# Patient Record
Sex: Female | Born: 1975
Health system: Southern US, Community
[De-identification: ages and names within clinical notes are randomized; demographics above are authoritative.]

## PROBLEM LIST (undated history)

## (undated) DIAGNOSIS — F329 Major depressive disorder, single episode, unspecified: Secondary | ICD-10-CM

## (undated) DIAGNOSIS — I10 Essential (primary) hypertension: Secondary | ICD-10-CM

## (undated) DIAGNOSIS — E119 Type 2 diabetes mellitus without complications: Secondary | ICD-10-CM

## (undated) DIAGNOSIS — D573 Sickle-cell trait: Secondary | ICD-10-CM

## (undated) DIAGNOSIS — F32A Depression, unspecified: Secondary | ICD-10-CM

## (undated) HISTORY — PX: TUBAL LIGATION: SHX77

## (undated) HISTORY — DX: Depression, unspecified: F32.A

## (undated) HISTORY — PX: OTHER SURGICAL HISTORY: SHX169

## (undated) HISTORY — DX: Major depressive disorder, single episode, unspecified: F32.9

## (undated) HISTORY — PX: LEEP: SHX91

---

## 2001-04-22 ENCOUNTER — Emergency Department (HOSPITAL_COMMUNITY): Admission: EM | Admit: 2001-04-22 | Discharge: 2001-04-22 | Payer: Self-pay | Admitting: Emergency Medicine

## 2001-07-31 ENCOUNTER — Emergency Department (HOSPITAL_COMMUNITY): Admission: EM | Admit: 2001-07-31 | Discharge: 2001-07-31 | Payer: Self-pay | Admitting: Emergency Medicine

## 2001-10-05 ENCOUNTER — Emergency Department (HOSPITAL_COMMUNITY): Admission: EM | Admit: 2001-10-05 | Discharge: 2001-10-06 | Payer: Self-pay

## 2002-01-09 ENCOUNTER — Emergency Department (HOSPITAL_COMMUNITY): Admission: EM | Admit: 2002-01-09 | Discharge: 2002-01-09 | Payer: Self-pay | Admitting: Emergency Medicine

## 2002-04-16 ENCOUNTER — Emergency Department (HOSPITAL_COMMUNITY): Admission: EM | Admit: 2002-04-16 | Discharge: 2002-04-17 | Payer: Self-pay | Admitting: Emergency Medicine

## 2003-08-24 ENCOUNTER — Emergency Department (HOSPITAL_COMMUNITY): Admission: AD | Admit: 2003-08-24 | Discharge: 2003-08-24 | Payer: Self-pay | Admitting: Emergency Medicine

## 2003-10-01 ENCOUNTER — Encounter: Payer: Self-pay | Admitting: Emergency Medicine

## 2003-10-01 ENCOUNTER — Emergency Department (HOSPITAL_COMMUNITY): Admission: EM | Admit: 2003-10-01 | Discharge: 2003-10-01 | Payer: Self-pay | Admitting: Emergency Medicine

## 2004-02-14 ENCOUNTER — Emergency Department (HOSPITAL_COMMUNITY): Admission: EM | Admit: 2004-02-14 | Discharge: 2004-02-14 | Payer: Self-pay | Admitting: Emergency Medicine

## 2004-04-09 ENCOUNTER — Emergency Department (HOSPITAL_COMMUNITY): Admission: EM | Admit: 2004-04-09 | Discharge: 2004-04-10 | Payer: Self-pay | Admitting: Emergency Medicine

## 2004-04-29 ENCOUNTER — Emergency Department (HOSPITAL_COMMUNITY): Admission: EM | Admit: 2004-04-29 | Discharge: 2004-04-29 | Payer: Self-pay | Admitting: Family Medicine

## 2004-05-17 ENCOUNTER — Emergency Department (HOSPITAL_COMMUNITY): Admission: EM | Admit: 2004-05-17 | Discharge: 2004-05-17 | Payer: Self-pay | Admitting: Family Medicine

## 2004-11-20 ENCOUNTER — Emergency Department (HOSPITAL_COMMUNITY): Admission: EM | Admit: 2004-11-20 | Discharge: 2004-11-20 | Payer: Self-pay | Admitting: Emergency Medicine

## 2005-02-28 ENCOUNTER — Emergency Department (HOSPITAL_COMMUNITY): Admission: EM | Admit: 2005-02-28 | Discharge: 2005-02-28 | Payer: Self-pay | Admitting: Emergency Medicine

## 2005-03-20 ENCOUNTER — Emergency Department (HOSPITAL_COMMUNITY): Admission: EM | Admit: 2005-03-20 | Discharge: 2005-03-20 | Payer: Self-pay | Admitting: Emergency Medicine

## 2005-06-02 ENCOUNTER — Ambulatory Visit: Payer: Self-pay | Admitting: Obstetrics and Gynecology

## 2005-06-30 ENCOUNTER — Other Ambulatory Visit: Admission: RE | Admit: 2005-06-30 | Discharge: 2005-06-30 | Payer: Self-pay | Admitting: Family Medicine

## 2005-06-30 ENCOUNTER — Encounter (INDEPENDENT_AMBULATORY_CARE_PROVIDER_SITE_OTHER): Payer: Self-pay | Admitting: *Deleted

## 2005-06-30 ENCOUNTER — Ambulatory Visit: Payer: Self-pay | Admitting: Obstetrics and Gynecology

## 2005-07-11 ENCOUNTER — Inpatient Hospital Stay (HOSPITAL_COMMUNITY): Admission: AD | Admit: 2005-07-11 | Discharge: 2005-07-11 | Payer: Self-pay | Admitting: Obstetrics & Gynecology

## 2005-07-19 ENCOUNTER — Ambulatory Visit: Payer: Self-pay | Admitting: Obstetrics and Gynecology

## 2005-08-06 ENCOUNTER — Emergency Department (HOSPITAL_COMMUNITY): Admission: EM | Admit: 2005-08-06 | Discharge: 2005-08-06 | Payer: Self-pay | Admitting: *Deleted

## 2005-08-14 ENCOUNTER — Emergency Department (HOSPITAL_COMMUNITY): Admission: EM | Admit: 2005-08-14 | Discharge: 2005-08-14 | Payer: Self-pay | Admitting: Emergency Medicine

## 2005-12-07 ENCOUNTER — Emergency Department (HOSPITAL_COMMUNITY): Admission: EM | Admit: 2005-12-07 | Discharge: 2005-12-07 | Payer: Self-pay | Admitting: Emergency Medicine

## 2005-12-08 ENCOUNTER — Emergency Department (HOSPITAL_COMMUNITY): Admission: EM | Admit: 2005-12-08 | Discharge: 2005-12-08 | Payer: Self-pay | Admitting: Emergency Medicine

## 2006-01-12 ENCOUNTER — Encounter: Payer: Self-pay | Admitting: Family Medicine

## 2006-01-12 ENCOUNTER — Ambulatory Visit: Payer: Self-pay | Admitting: Family Medicine

## 2006-01-23 ENCOUNTER — Emergency Department (HOSPITAL_COMMUNITY): Admission: EM | Admit: 2006-01-23 | Discharge: 2006-01-23 | Payer: Self-pay | Admitting: Family Medicine

## 2006-01-25 ENCOUNTER — Emergency Department (HOSPITAL_COMMUNITY): Admission: EM | Admit: 2006-01-25 | Discharge: 2006-01-25 | Payer: Self-pay | Admitting: Emergency Medicine

## 2006-03-13 ENCOUNTER — Emergency Department (HOSPITAL_COMMUNITY): Admission: EM | Admit: 2006-03-13 | Discharge: 2006-03-13 | Payer: Self-pay | Admitting: Emergency Medicine

## 2006-03-19 ENCOUNTER — Emergency Department (HOSPITAL_COMMUNITY): Admission: EM | Admit: 2006-03-19 | Discharge: 2006-03-19 | Payer: Self-pay | Admitting: Emergency Medicine

## 2006-04-03 ENCOUNTER — Emergency Department (HOSPITAL_COMMUNITY): Admission: EM | Admit: 2006-04-03 | Discharge: 2006-04-03 | Payer: Self-pay | Admitting: Family Medicine

## 2006-05-11 ENCOUNTER — Emergency Department (HOSPITAL_COMMUNITY): Admission: EM | Admit: 2006-05-11 | Discharge: 2006-05-11 | Payer: Self-pay | Admitting: Emergency Medicine

## 2006-08-21 ENCOUNTER — Emergency Department (HOSPITAL_COMMUNITY): Admission: EM | Admit: 2006-08-21 | Discharge: 2006-08-21 | Payer: Self-pay | Admitting: *Deleted

## 2006-08-24 ENCOUNTER — Emergency Department (HOSPITAL_COMMUNITY): Admission: EM | Admit: 2006-08-24 | Discharge: 2006-08-24 | Payer: Self-pay | Admitting: Emergency Medicine

## 2006-12-28 ENCOUNTER — Emergency Department (HOSPITAL_COMMUNITY): Admission: EM | Admit: 2006-12-28 | Discharge: 2006-12-28 | Payer: Self-pay | Admitting: Emergency Medicine

## 2007-01-25 ENCOUNTER — Inpatient Hospital Stay (HOSPITAL_COMMUNITY): Admission: AD | Admit: 2007-01-25 | Discharge: 2007-01-25 | Payer: Self-pay | Admitting: Family Medicine

## 2007-02-08 ENCOUNTER — Emergency Department (HOSPITAL_COMMUNITY): Admission: EM | Admit: 2007-02-08 | Discharge: 2007-02-08 | Payer: Self-pay | Admitting: Family Medicine

## 2007-03-29 ENCOUNTER — Ambulatory Visit: Payer: Self-pay | Admitting: Obstetrics & Gynecology

## 2007-03-29 ENCOUNTER — Encounter: Payer: Self-pay | Admitting: Obstetrics & Gynecology

## 2007-04-10 ENCOUNTER — Emergency Department (HOSPITAL_COMMUNITY): Admission: EM | Admit: 2007-04-10 | Discharge: 2007-04-11 | Payer: Self-pay | Admitting: Emergency Medicine

## 2007-04-16 ENCOUNTER — Inpatient Hospital Stay (HOSPITAL_COMMUNITY): Admission: AD | Admit: 2007-04-16 | Discharge: 2007-04-16 | Payer: Self-pay | Admitting: Obstetrics & Gynecology

## 2007-04-17 ENCOUNTER — Inpatient Hospital Stay (HOSPITAL_COMMUNITY): Admission: AD | Admit: 2007-04-17 | Discharge: 2007-04-17 | Payer: Self-pay | Admitting: Gynecology

## 2007-07-10 ENCOUNTER — Inpatient Hospital Stay (HOSPITAL_COMMUNITY): Admission: AD | Admit: 2007-07-10 | Discharge: 2007-07-10 | Payer: Self-pay | Admitting: Gynecology

## 2007-07-24 ENCOUNTER — Ambulatory Visit (HOSPITAL_COMMUNITY): Admission: RE | Admit: 2007-07-24 | Discharge: 2007-07-24 | Payer: Self-pay | Admitting: Family Medicine

## 2007-09-02 ENCOUNTER — Ambulatory Visit: Payer: Self-pay | Admitting: Physician Assistant

## 2007-09-02 ENCOUNTER — Inpatient Hospital Stay (HOSPITAL_COMMUNITY): Admission: AD | Admit: 2007-09-02 | Discharge: 2007-09-02 | Payer: Self-pay | Admitting: Obstetrics & Gynecology

## 2007-09-13 ENCOUNTER — Ambulatory Visit: Payer: Self-pay | Admitting: Family Medicine

## 2007-09-17 ENCOUNTER — Ambulatory Visit (HOSPITAL_COMMUNITY): Admission: RE | Admit: 2007-09-17 | Discharge: 2007-09-17 | Payer: Self-pay | Admitting: Obstetrics and Gynecology

## 2007-09-27 ENCOUNTER — Ambulatory Visit: Payer: Self-pay | Admitting: Family Medicine

## 2007-10-11 ENCOUNTER — Ambulatory Visit: Payer: Self-pay | Admitting: Family Medicine

## 2007-10-15 ENCOUNTER — Ambulatory Visit: Payer: Self-pay | Admitting: Obstetrics & Gynecology

## 2007-10-18 ENCOUNTER — Ambulatory Visit: Payer: Self-pay | Admitting: Obstetrics & Gynecology

## 2007-10-22 ENCOUNTER — Ambulatory Visit: Payer: Self-pay | Admitting: Gynecology

## 2007-10-25 ENCOUNTER — Ambulatory Visit (HOSPITAL_COMMUNITY): Admission: RE | Admit: 2007-10-25 | Discharge: 2007-10-25 | Payer: Self-pay | Admitting: Family Medicine

## 2007-10-25 ENCOUNTER — Ambulatory Visit: Payer: Self-pay | Admitting: Obstetrics & Gynecology

## 2007-10-29 ENCOUNTER — Ambulatory Visit: Payer: Self-pay | Admitting: Obstetrics & Gynecology

## 2007-10-31 ENCOUNTER — Inpatient Hospital Stay (HOSPITAL_COMMUNITY): Admission: AD | Admit: 2007-10-31 | Discharge: 2007-10-31 | Payer: Self-pay | Admitting: Obstetrics & Gynecology

## 2007-10-31 ENCOUNTER — Ambulatory Visit: Payer: Self-pay | Admitting: *Deleted

## 2007-11-01 ENCOUNTER — Ambulatory Visit (HOSPITAL_COMMUNITY): Admission: RE | Admit: 2007-11-01 | Discharge: 2007-11-01 | Payer: Self-pay | Admitting: Obstetrics & Gynecology

## 2007-11-01 ENCOUNTER — Ambulatory Visit: Payer: Self-pay | Admitting: *Deleted

## 2007-11-02 ENCOUNTER — Ambulatory Visit: Payer: Self-pay | Admitting: Obstetrics and Gynecology

## 2007-11-02 ENCOUNTER — Inpatient Hospital Stay (HOSPITAL_COMMUNITY): Admission: AD | Admit: 2007-11-02 | Discharge: 2007-11-06 | Payer: Self-pay | Admitting: Obstetrics & Gynecology

## 2007-11-09 ENCOUNTER — Emergency Department (HOSPITAL_COMMUNITY): Admission: EM | Admit: 2007-11-09 | Discharge: 2007-11-09 | Payer: Self-pay | Admitting: Family Medicine

## 2008-03-17 ENCOUNTER — Emergency Department (HOSPITAL_COMMUNITY): Admission: EM | Admit: 2008-03-17 | Discharge: 2008-03-17 | Payer: Self-pay | Admitting: Family Medicine

## 2008-06-04 ENCOUNTER — Emergency Department (HOSPITAL_COMMUNITY): Admission: EM | Admit: 2008-06-04 | Discharge: 2008-06-04 | Payer: Self-pay | Admitting: Emergency Medicine

## 2008-11-27 ENCOUNTER — Emergency Department (HOSPITAL_COMMUNITY): Admission: EM | Admit: 2008-11-27 | Discharge: 2008-11-27 | Payer: Self-pay | Admitting: Emergency Medicine

## 2009-06-28 IMAGING — US US OB FOLLOW-UP
1 series · 14 of 28 positions shown · non-contrast
Comparison: none

OBSTETRICAL ULTRASOUND:

 This ultrasound exam was performed in the [HOSPITAL] Ultrasound Department.  The OB US report was generated in the AS system, and faxed to the ordering physician.  This report is also available in [REDACTED] PACS.

[Series 1: us ob follow-up · 14 of 31 slices shown]
[im 2/31]
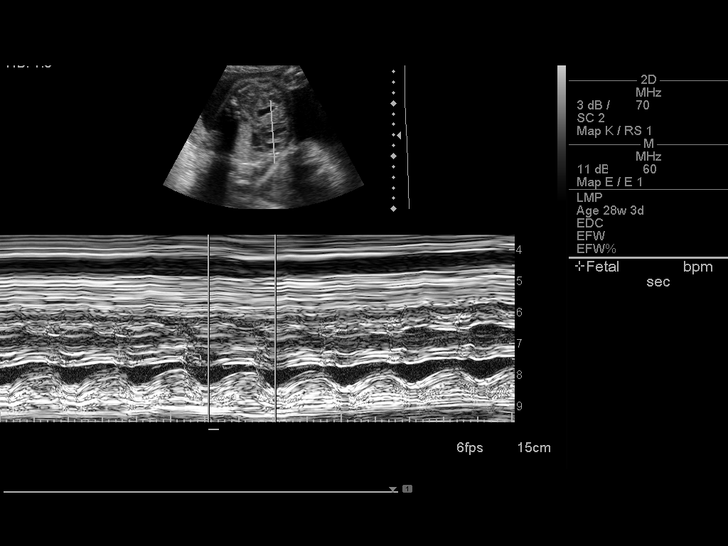
[im 4/31]
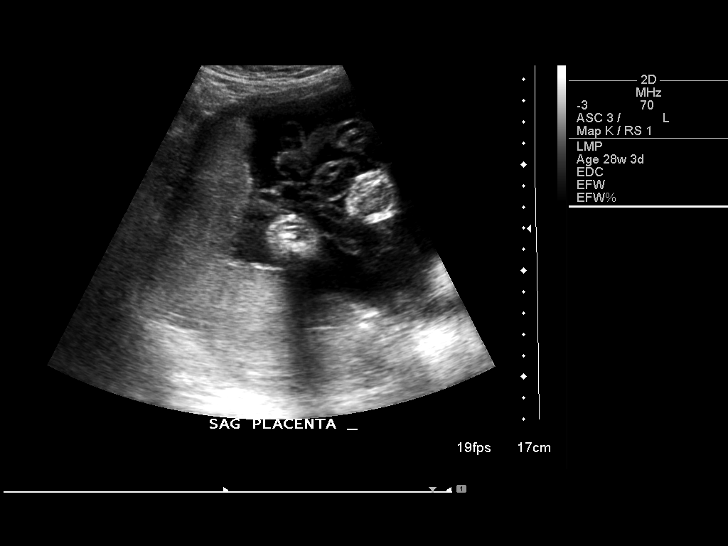
[im 6/31]
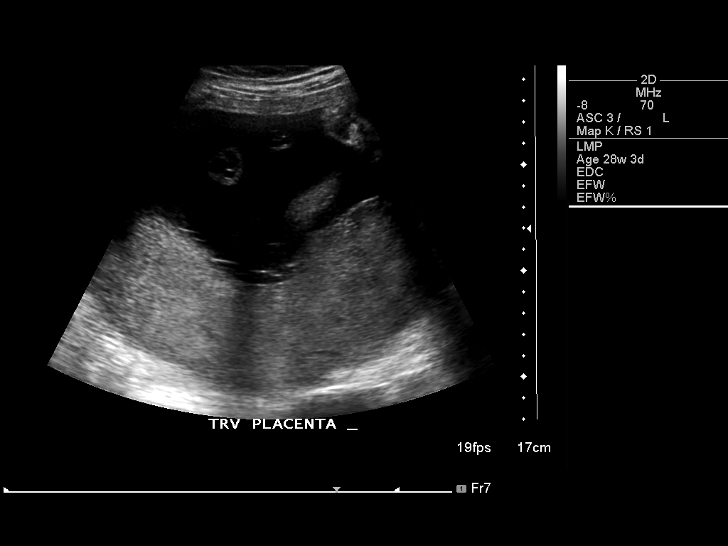
[im 8/31]
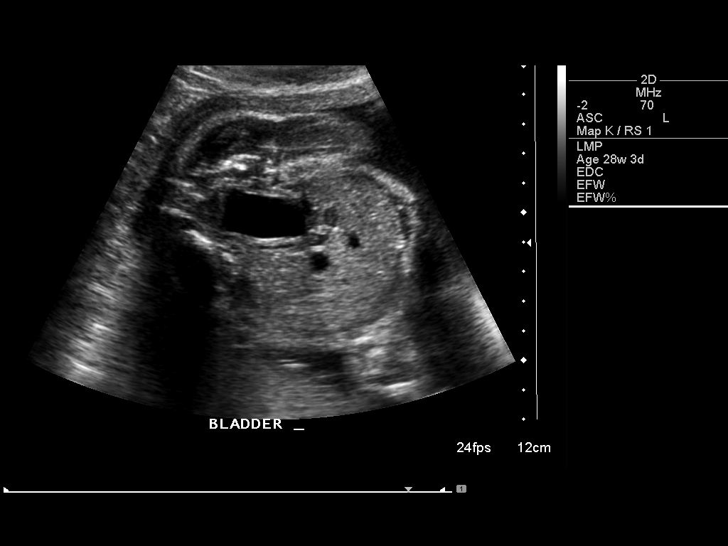
[im 11/31]
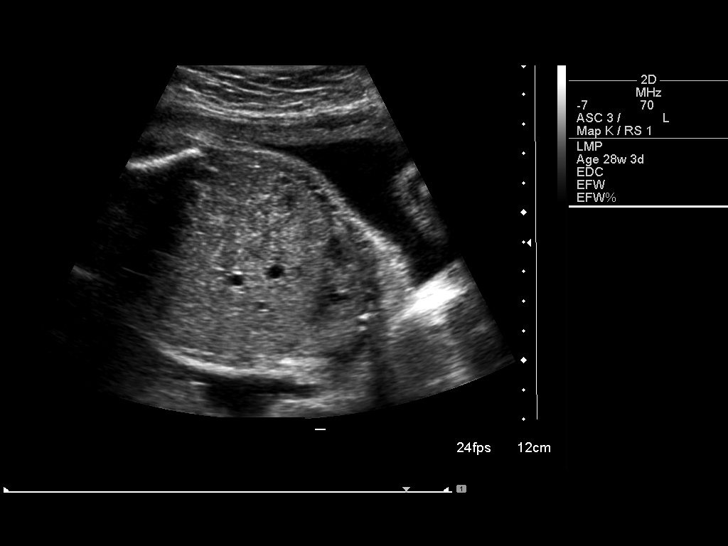
[im 13/31]
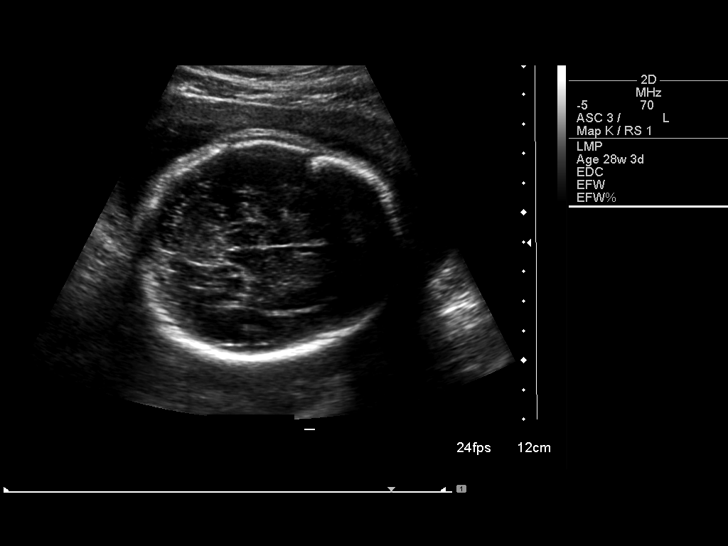
[im 15/31]
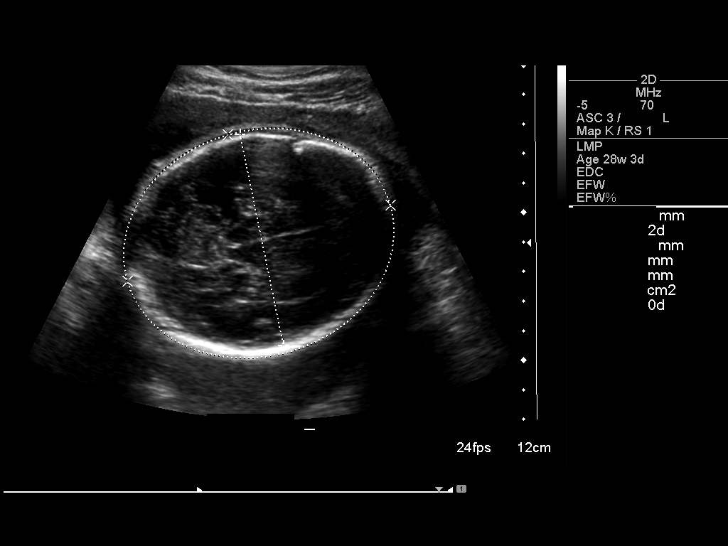
[im 17/31]
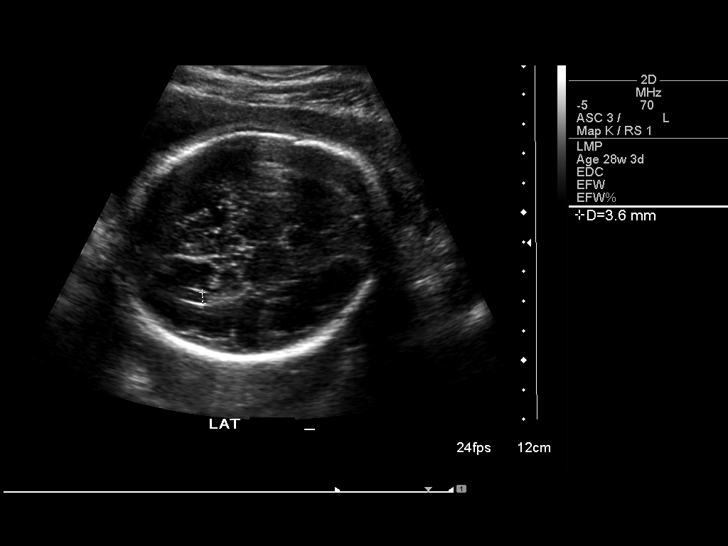
[im 19/31]
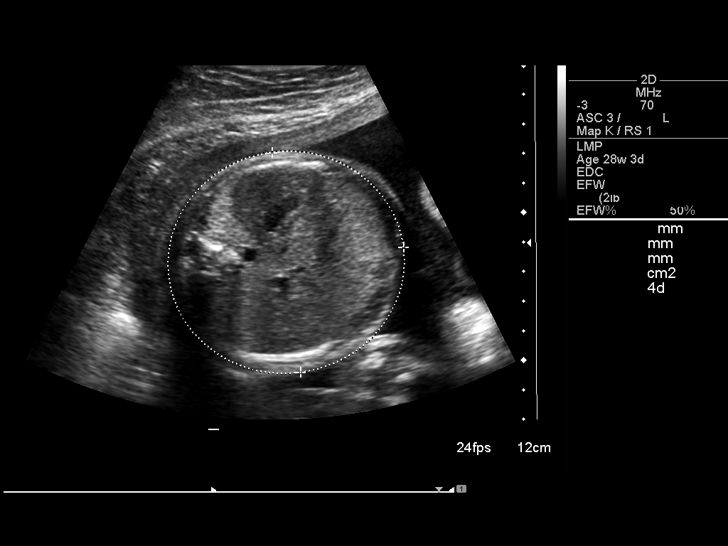
[im 22/31]
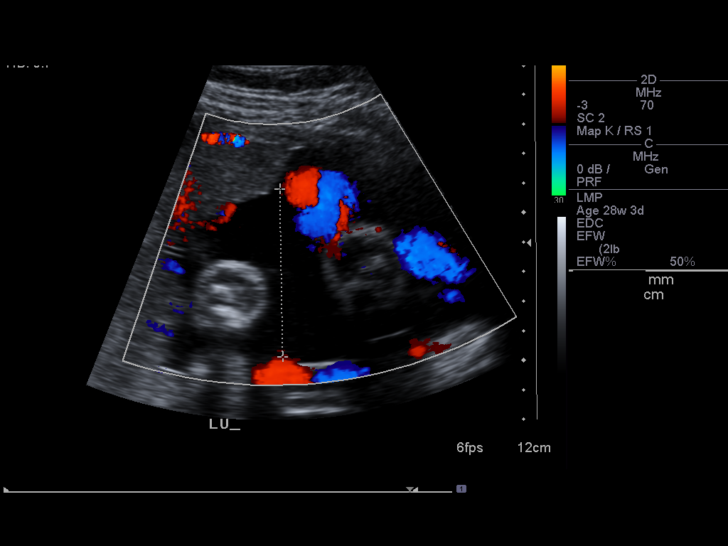
[im 24/31]
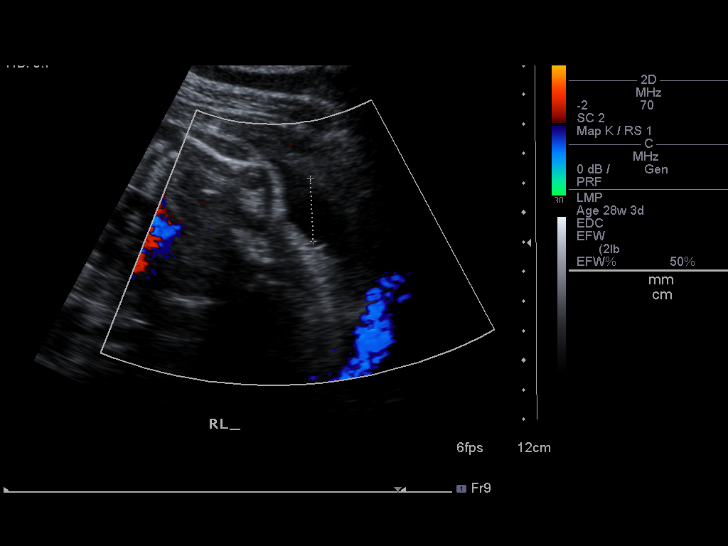
[im 26/31]
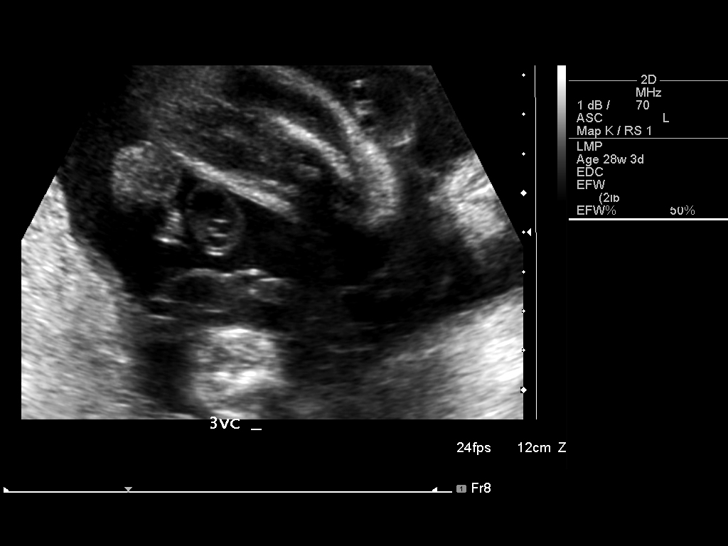
[im 28/31]
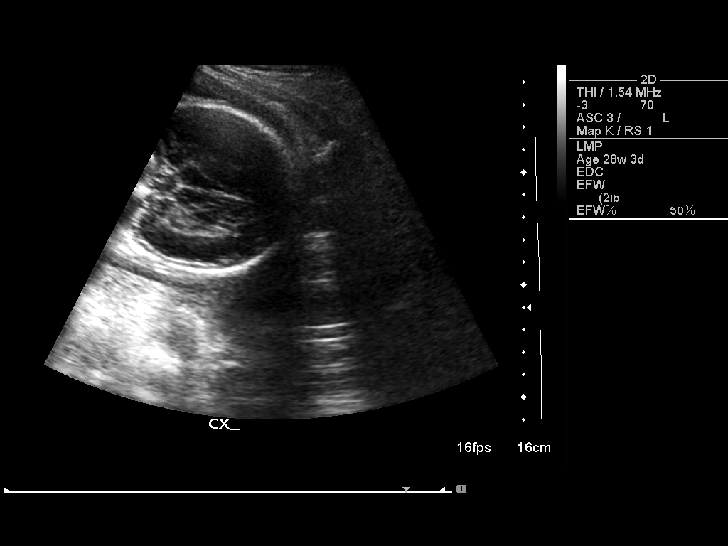
[im 31/31]
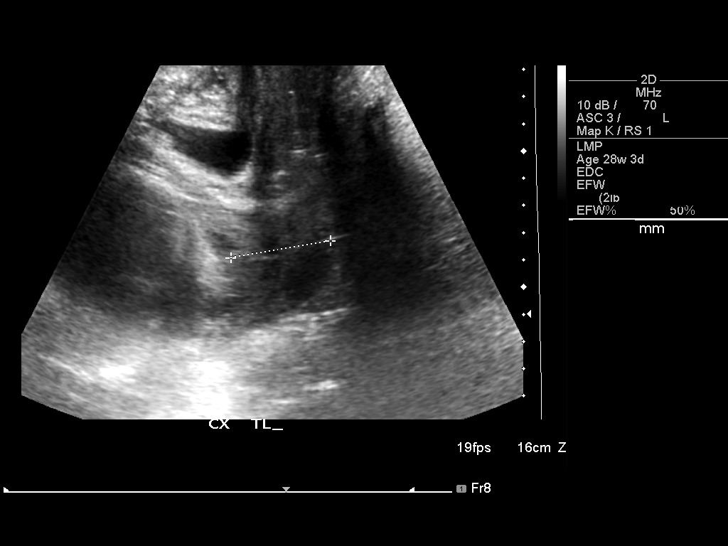

[14 of 28 positions shown; findings below may reference images not displayed]

IMPRESSION: See AS Obstetric US report.

## 2009-08-10 ENCOUNTER — Ambulatory Visit: Payer: Self-pay | Admitting: Advanced Practice Midwife

## 2009-08-10 ENCOUNTER — Inpatient Hospital Stay (HOSPITAL_COMMUNITY): Admission: AD | Admit: 2009-08-10 | Discharge: 2009-08-10 | Payer: Self-pay | Admitting: Obstetrics & Gynecology

## 2009-11-03 ENCOUNTER — Ambulatory Visit (HOSPITAL_COMMUNITY): Admission: RE | Admit: 2009-11-03 | Discharge: 2009-11-03 | Payer: Self-pay | Admitting: Obstetrics & Gynecology

## 2010-01-14 ENCOUNTER — Inpatient Hospital Stay (HOSPITAL_COMMUNITY): Admission: AD | Admit: 2010-01-14 | Discharge: 2010-01-15 | Payer: Self-pay | Admitting: Obstetrics & Gynecology

## 2010-01-18 ENCOUNTER — Encounter: Admission: RE | Admit: 2010-01-18 | Discharge: 2010-03-01 | Payer: Self-pay | Admitting: Family Medicine

## 2010-01-18 ENCOUNTER — Ambulatory Visit: Payer: Self-pay | Admitting: Obstetrics & Gynecology

## 2010-01-25 ENCOUNTER — Ambulatory Visit: Payer: Self-pay | Admitting: Obstetrics & Gynecology

## 2010-01-26 ENCOUNTER — Ambulatory Visit (HOSPITAL_COMMUNITY): Admission: RE | Admit: 2010-01-26 | Discharge: 2010-01-26 | Payer: Self-pay | Admitting: Family Medicine

## 2010-01-31 ENCOUNTER — Inpatient Hospital Stay (HOSPITAL_COMMUNITY): Admission: AD | Admit: 2010-01-31 | Discharge: 2010-02-01 | Payer: Self-pay | Admitting: Obstetrics and Gynecology

## 2010-02-08 ENCOUNTER — Ambulatory Visit: Payer: Self-pay | Admitting: Obstetrics & Gynecology

## 2010-02-11 ENCOUNTER — Ambulatory Visit (HOSPITAL_COMMUNITY): Admission: RE | Admit: 2010-02-11 | Discharge: 2010-02-11 | Payer: Self-pay | Admitting: Family Medicine

## 2010-02-15 ENCOUNTER — Ambulatory Visit: Payer: Self-pay | Admitting: Obstetrics & Gynecology

## 2010-02-18 ENCOUNTER — Ambulatory Visit: Payer: Self-pay | Admitting: Obstetrics & Gynecology

## 2010-02-22 ENCOUNTER — Encounter: Payer: Self-pay | Admitting: Family Medicine

## 2010-02-22 ENCOUNTER — Ambulatory Visit: Payer: Self-pay | Admitting: Obstetrics & Gynecology

## 2010-02-22 LAB — CONVERTED CEMR LAB
Chlamydia, DNA Probe: POSITIVE — AB
GC Probe Amp, Genital: NEGATIVE

## 2010-02-23 ENCOUNTER — Encounter: Payer: Self-pay | Admitting: Family Medicine

## 2010-02-25 ENCOUNTER — Ambulatory Visit: Payer: Self-pay | Admitting: Obstetrics & Gynecology

## 2010-03-01 ENCOUNTER — Encounter: Payer: Self-pay | Admitting: Obstetrics and Gynecology

## 2010-03-01 ENCOUNTER — Ambulatory Visit: Payer: Self-pay | Admitting: Obstetrics & Gynecology

## 2010-03-01 LAB — CONVERTED CEMR LAB
ALT: <8 units/L (ref 0–35)
CO2: 19 meq/L (ref 19–32)
Calcium: 8.6 mg/dL (ref 8.4–10.5)
Chloride: 108 meq/L (ref 96–112)
Platelets: 207 10*3/uL (ref 150–400)
Potassium: 3.8 meq/L (ref 3.5–5.3)
Sodium: 140 meq/L (ref 135–145)
Total Protein: 6.3 g/dL (ref 6.0–8.3)
Uric Acid, Serum: 3.8 mg/dL (ref 2.4–7.0)
WBC: 11.3 10*3/uL — ABNORMAL HIGH (ref 4.0–10.5)

## 2010-03-02 ENCOUNTER — Ambulatory Visit: Payer: Self-pay | Admitting: Family

## 2010-03-02 ENCOUNTER — Inpatient Hospital Stay (HOSPITAL_COMMUNITY): Admission: AD | Admit: 2010-03-02 | Discharge: 2010-03-05 | Payer: Self-pay | Admitting: Obstetrics & Gynecology

## 2010-03-08 ENCOUNTER — Ambulatory Visit: Payer: Self-pay | Admitting: Obstetrics and Gynecology

## 2010-03-08 ENCOUNTER — Ambulatory Visit (HOSPITAL_COMMUNITY): Admission: RE | Admit: 2010-03-08 | Discharge: 2010-03-08 | Payer: Self-pay | Admitting: Obstetrics and Gynecology

## 2010-03-11 ENCOUNTER — Ambulatory Visit: Payer: Self-pay | Admitting: Family

## 2010-03-11 ENCOUNTER — Inpatient Hospital Stay (HOSPITAL_COMMUNITY): Admission: AD | Admit: 2010-03-11 | Discharge: 2010-03-14 | Payer: Self-pay | Admitting: Obstetrics & Gynecology

## 2010-07-22 ENCOUNTER — Ambulatory Visit: Payer: Self-pay | Admitting: Obstetrics and Gynecology

## 2011-01-09 ENCOUNTER — Encounter: Payer: Self-pay | Admitting: *Deleted

## 2011-01-09 ENCOUNTER — Encounter: Payer: Self-pay | Admitting: Family Medicine

## 2011-02-07 ENCOUNTER — Emergency Department (HOSPITAL_COMMUNITY): Payer: Medicaid Other

## 2011-02-07 ENCOUNTER — Emergency Department (HOSPITAL_COMMUNITY)
Admission: EM | Admit: 2011-02-07 | Discharge: 2011-02-07 | Disposition: A | Discharge status: Home / Self Care | Payer: Medicaid Other | Attending: Emergency Medicine | Admitting: Emergency Medicine

## 2011-02-07 DIAGNOSIS — R141 Gas pain: Secondary | ICD-10-CM | POA: Insufficient documentation

## 2011-02-07 DIAGNOSIS — F329 Major depressive disorder, single episode, unspecified: Secondary | ICD-10-CM | POA: Insufficient documentation

## 2011-02-07 DIAGNOSIS — F3289 Other specified depressive episodes: Secondary | ICD-10-CM | POA: Insufficient documentation

## 2011-02-07 DIAGNOSIS — R143 Flatulence: Secondary | ICD-10-CM | POA: Insufficient documentation

## 2011-02-07 DIAGNOSIS — K5900 Constipation, unspecified: Secondary | ICD-10-CM | POA: Insufficient documentation

## 2011-02-07 DIAGNOSIS — R109 Unspecified abdominal pain: Secondary | ICD-10-CM | POA: Insufficient documentation

## 2011-02-07 DIAGNOSIS — I10 Essential (primary) hypertension: Secondary | ICD-10-CM | POA: Insufficient documentation

## 2011-02-07 DIAGNOSIS — Z79899 Other long term (current) drug therapy: Secondary | ICD-10-CM | POA: Insufficient documentation

## 2011-02-07 DIAGNOSIS — R142 Eructation: Secondary | ICD-10-CM | POA: Insufficient documentation

## 2011-03-04 LAB — POCT URINALYSIS DIPSTICK
Protein, ur: NEGATIVE mg/dL
Urobilinogen, UA: 0.2 mg/dL (ref 0.0–1.0)
pH: 5.5 (ref 5.0–8.0)

## 2011-03-06 LAB — URINALYSIS, ROUTINE W REFLEX MICROSCOPIC
Bilirubin Urine: NEGATIVE
Glucose, UA: NEGATIVE mg/dL
Ketones, ur: >80 mg/dL — AB
Nitrite: NEGATIVE
Protein, ur: NEGATIVE mg/dL

## 2011-03-06 LAB — CBC
Hemoglobin: 8.4 g/dL — ABNORMAL LOW (ref 12.0–15.0)
MCHC: 31.9 g/dL (ref 30.0–36.0)
RBC: 3.89 MIL/uL (ref 3.87–5.11)
WBC: 11.1 10*3/uL — ABNORMAL HIGH (ref 4.0–10.5)

## 2011-03-06 LAB — COMPREHENSIVE METABOLIC PANEL
ALT: 10 U/L (ref 0–35)
Alkaline Phosphatase: 127 U/L — ABNORMAL HIGH (ref 39–117)
BUN: 2 mg/dL — ABNORMAL LOW (ref 6–23)
CO2: 20 mEq/L (ref 19–32)
GFR calc non Af Amer: >60 mL/min (ref >60)
Glucose, Bld: 75 mg/dL (ref 70–99)
Potassium: 3.3 mEq/L — ABNORMAL LOW (ref 3.5–5.1)
Sodium: 134 mEq/L — ABNORMAL LOW (ref 135–145)
Total Bilirubin: 0.8 mg/dL (ref 0.3–1.2)
Total Protein: 6.3 g/dL (ref 6.0–8.3)

## 2011-03-06 LAB — DIFFERENTIAL
Basophils Absolute: 0.1 10*3/uL (ref 0.0–0.1)
Basophils Relative: 1 % (ref 0–1)
Eosinophils Absolute: 0 10*3/uL (ref 0.0–0.7)
Eosinophils Relative: 0 % (ref 0–5)
Lymphs Abs: 0.7 10*3/uL (ref 0.7–4.0)
Neutrophils Relative %: 86 % — ABNORMAL HIGH (ref 43–77)

## 2011-03-06 LAB — POCT URINALYSIS DIP (DEVICE)
Bilirubin Urine: NEGATIVE
Glucose, UA: NEGATIVE mg/dL
Hgb urine dipstick: NEGATIVE
Nitrite: NEGATIVE
Urobilinogen, UA: 0.2 mg/dL (ref 0.0–1.0)

## 2011-03-06 LAB — WET PREP, GENITAL

## 2011-03-09 LAB — POCT URINALYSIS DIP (DEVICE)
Bilirubin Urine: NEGATIVE
Bilirubin Urine: NEGATIVE
Glucose, UA: NEGATIVE mg/dL
Glucose, UA: NEGATIVE mg/dL
Ketones, ur: NEGATIVE mg/dL
Ketones, ur: NEGATIVE mg/dL
Nitrite: NEGATIVE
Nitrite: NEGATIVE
Protein, ur: NEGATIVE mg/dL
Specific Gravity, Urine: <=1.005 (ref 1.005–1.030)
Specific Gravity, Urine: <=1.005 (ref 1.005–1.030)
Urobilinogen, UA: 0.2 mg/dL (ref 0.0–1.0)
Urobilinogen, UA: 0.2 mg/dL (ref 0.0–1.0)
pH: 6.5 (ref 5.0–8.0)

## 2011-03-13 LAB — CBC
HCT: 27.2 % — ABNORMAL LOW (ref 36.0–46.0)
HCT: 28.4 % — ABNORMAL LOW (ref 36.0–46.0)
HCT: 29.2 % — ABNORMAL LOW (ref 36.0–46.0)
Hemoglobin: 7.7 g/dL — ABNORMAL LOW (ref 12.0–15.0)
Hemoglobin: 8.5 g/dL — ABNORMAL LOW (ref 12.0–15.0)
Hemoglobin: 9.3 g/dL — ABNORMAL LOW (ref 12.0–15.0)
MCHC: 31 g/dL (ref 30.0–36.0)
MCHC: 31.7 g/dL (ref 30.0–36.0)
MCHC: 31.9 g/dL (ref 30.0–36.0)
MCHC: 32.3 g/dL (ref 30.0–36.0)
MCV: 65.9 fL — ABNORMAL LOW (ref 78.0–100.0)
MCV: 66 fL — ABNORMAL LOW (ref 78.0–100.0)
MCV: 66.4 fL — ABNORMAL LOW (ref 78.0–100.0)
MCV: 66.5 fL — ABNORMAL LOW (ref 78.0–100.0)
MCV: 66.7 fL — ABNORMAL LOW (ref 78.0–100.0)
Platelets: 169 10*3/uL (ref 150–400)
Platelets: 184 10*3/uL (ref 150–400)
Platelets: 186 10*3/uL (ref 150–400)
Platelets: 210 10*3/uL (ref 150–400)
RBC: 3.68 MIL/uL — ABNORMAL LOW (ref 3.87–5.11)
RBC: 3.98 MIL/uL (ref 3.87–5.11)
RBC: 4.4 MIL/uL (ref 3.87–5.11)
RDW: 20.6 % — ABNORMAL HIGH (ref 11.5–15.5)
RDW: 20.9 % — ABNORMAL HIGH (ref 11.5–15.5)
WBC: 11.6 10*3/uL — ABNORMAL HIGH (ref 4.0–10.5)
WBC: 19.1 10*3/uL — ABNORMAL HIGH (ref 4.0–10.5)
WBC: 24.3 10*3/uL — ABNORMAL HIGH (ref 4.0–10.5)

## 2011-03-13 LAB — GLUCOSE, CAPILLARY
Glucose-Capillary: 108 mg/dL — ABNORMAL HIGH (ref 70–99)
Glucose-Capillary: 55 mg/dL — ABNORMAL LOW (ref 70–99)
Glucose-Capillary: 58 mg/dL — ABNORMAL LOW (ref 70–99)
Glucose-Capillary: 62 mg/dL — ABNORMAL LOW (ref 70–99)
Glucose-Capillary: 63 mg/dL — ABNORMAL LOW (ref 70–99)
Glucose-Capillary: 65 mg/dL — ABNORMAL LOW (ref 70–99)
Glucose-Capillary: 67 mg/dL — ABNORMAL LOW (ref 70–99)
Glucose-Capillary: 71 mg/dL (ref 70–99)
Glucose-Capillary: 75 mg/dL (ref 70–99)
Glucose-Capillary: 87 mg/dL (ref 70–99)
Glucose-Capillary: 87 mg/dL (ref 70–99)
Glucose-Capillary: 96 mg/dL (ref 70–99)

## 2011-03-13 LAB — POCT URINALYSIS DIP (DEVICE)
Bilirubin Urine: NEGATIVE
Bilirubin Urine: NEGATIVE
Bilirubin Urine: NEGATIVE
Glucose, UA: NEGATIVE mg/dL
Glucose, UA: NEGATIVE mg/dL
Glucose, UA: NEGATIVE mg/dL
Hgb urine dipstick: NEGATIVE
Hgb urine dipstick: NEGATIVE
Ketones, ur: NEGATIVE mg/dL
Ketones, ur: NEGATIVE mg/dL
Ketones, ur: NEGATIVE mg/dL
Nitrite: NEGATIVE
Specific Gravity, Urine: 1.01 (ref 1.005–1.030)
Specific Gravity, Urine: <=1.005 (ref 1.005–1.030)
Specific Gravity, Urine: <=1.005 (ref 1.005–1.030)
Urobilinogen, UA: 1 mg/dL (ref 0.0–1.0)

## 2011-03-13 LAB — COMPREHENSIVE METABOLIC PANEL
ALT: 10 U/L (ref 0–35)
ALT: 11 U/L (ref 0–35)
ALT: 15 U/L (ref 0–35)
AST: 14 U/L (ref 0–37)
AST: 16 U/L (ref 0–37)
AST: 18 U/L (ref 0–37)
AST: 20 U/L (ref 0–37)
AST: 22 U/L (ref 0–37)
Albumin: 2.6 g/dL — ABNORMAL LOW (ref 3.5–5.2)
Albumin: 2.6 g/dL — ABNORMAL LOW (ref 3.5–5.2)
Albumin: 2.7 g/dL — ABNORMAL LOW (ref 3.5–5.2)
Alkaline Phosphatase: 188 U/L — ABNORMAL HIGH (ref 39–117)
Alkaline Phosphatase: 190 U/L — ABNORMAL HIGH (ref 39–117)
BUN: 4 mg/dL — ABNORMAL LOW (ref 6–23)
BUN: 5 mg/dL — ABNORMAL LOW (ref 6–23)
BUN: 6 mg/dL (ref 6–23)
BUN: 6 mg/dL (ref 6–23)
CO2: 19 mEq/L (ref 19–32)
CO2: 23 mEq/L (ref 19–32)
CO2: 24 mEq/L (ref 19–32)
Calcium: 8 mg/dL — ABNORMAL LOW (ref 8.4–10.5)
Calcium: 8.4 mg/dL (ref 8.4–10.5)
Calcium: 8.7 mg/dL (ref 8.4–10.5)
Chloride: 106 mEq/L (ref 96–112)
Chloride: 107 mEq/L (ref 96–112)
Chloride: 107 mEq/L (ref 96–112)
Chloride: 107 mEq/L (ref 96–112)
Chloride: 109 mEq/L (ref 96–112)
Creatinine, Ser: 0.62 mg/dL (ref 0.4–1.2)
Creatinine, Ser: 0.86 mg/dL (ref 0.4–1.2)
Creatinine, Ser: 0.9 mg/dL (ref 0.4–1.2)
Creatinine, Ser: 0.95 mg/dL (ref 0.4–1.2)
GFR calc Af Amer: >60 mL/min (ref >60)
GFR calc Af Amer: >60 mL/min (ref >60)
GFR calc Af Amer: >60 mL/min (ref >60)
GFR calc non Af Amer: >60 mL/min (ref >60)
GFR calc non Af Amer: >60 mL/min (ref >60)
GFR calc non Af Amer: >60 mL/min (ref >60)
Glucose, Bld: 58 mg/dL — ABNORMAL LOW (ref 70–99)
Glucose, Bld: 77 mg/dL (ref 70–99)
Glucose, Bld: 90 mg/dL (ref 70–99)
Potassium: 3.1 mEq/L — ABNORMAL LOW (ref 3.5–5.1)
Potassium: 3.6 mEq/L (ref 3.5–5.1)
Potassium: 3.6 mEq/L (ref 3.5–5.1)
Sodium: 138 mEq/L (ref 135–145)
Sodium: 139 mEq/L (ref 135–145)
Sodium: 139 mEq/L (ref 135–145)
Sodium: 139 mEq/L (ref 135–145)
Total Bilirubin: 0.2 mg/dL — ABNORMAL LOW (ref 0.3–1.2)
Total Bilirubin: 0.3 mg/dL (ref 0.3–1.2)
Total Bilirubin: 0.4 mg/dL (ref 0.3–1.2)
Total Bilirubin: 0.6 mg/dL (ref 0.3–1.2)
Total Bilirubin: <0.1 mg/dL — ABNORMAL LOW (ref 0.3–1.2)
Total Protein: 5.4 g/dL — ABNORMAL LOW (ref 6.0–8.3)
Total Protein: 6 g/dL (ref 6.0–8.3)
Total Protein: 6.1 g/dL (ref 6.0–8.3)

## 2011-03-13 LAB — URINALYSIS, ROUTINE W REFLEX MICROSCOPIC
Bilirubin Urine: NEGATIVE
Glucose, UA: NEGATIVE mg/dL
Ketones, ur: 15 mg/dL — AB
Ketones, ur: NEGATIVE mg/dL
Leukocytes, UA: NEGATIVE
Nitrite: NEGATIVE
Protein, ur: NEGATIVE mg/dL
Protein, ur: NEGATIVE mg/dL
Specific Gravity, Urine: 1.01 (ref 1.005–1.030)
Urobilinogen, UA: 0.2 mg/dL (ref 0.0–1.0)
Urobilinogen, UA: 0.2 mg/dL (ref 0.0–1.0)
pH: 5 (ref 5.0–8.0)
pH: 6.5 (ref 5.0–8.0)

## 2011-03-13 LAB — URINE MICROSCOPIC-ADD ON

## 2011-03-13 LAB — PROTEIN / CREATININE RATIO, URINE
Protein Creatinine Ratio: 0.46 — ABNORMAL HIGH (ref 0.00–0.15)
Total Protein, Urine: 34 mg/dL

## 2011-03-13 LAB — HEMOGLOBIN A1C

## 2011-03-13 LAB — PROTEIN, URINE, 24 HOUR
Collection Interval-UPROT: 24 hours
Protein, 24H Urine: 193 mg/d — ABNORMAL HIGH (ref 50–100)
Protein, Urine: 7 mg/dL
Urine Total Volume-UPROT: 2750 mL

## 2011-03-13 LAB — URIC ACID: Uric Acid, Serum: 4.9 mg/dL (ref 2.4–7.0)

## 2011-03-13 LAB — RPR: RPR Ser Ql: NONREACTIVE

## 2011-03-13 LAB — CREATININE CLEARANCE, URINE, 24 HOUR: Creatinine Clearance: 270 mL/min — ABNORMAL HIGH (ref 75–115)

## 2011-03-26 LAB — URINALYSIS, ROUTINE W REFLEX MICROSCOPIC
Bilirubin Urine: NEGATIVE
Glucose, UA: NEGATIVE mg/dL
Hgb urine dipstick: NEGATIVE
Nitrite: NEGATIVE
Specific Gravity, Urine: 1.01 (ref 1.005–1.030)
pH: 6.5 (ref 5.0–8.0)

## 2011-03-26 LAB — GC/CHLAMYDIA PROBE AMP, GENITAL: Chlamydia, DNA Probe: NEGATIVE

## 2011-03-26 LAB — WET PREP, GENITAL: Yeast Wet Prep HPF POC: NONE SEEN
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
## 2011-05-06 NOTE — Group Therapy Note (Signed)
[name], [name] NO.:  [id number]   MEDICAL RECORD NO.:  [id number]          PATIENT TYPE:  WOC   LOCATION:  [facility]                   FACILITY:  [facility]   PHYSICIAN:  Tinnie Gens, MD        DATE OF BIRTH:  Feb 17, 1976   DATE OF SERVICE:  06/30/2005                                    CLINIC NOTE   CHIEF COMPLAINT:  LEEP.   HISTORY OF PRESENT ILLNESS:  The patient is a 35 year old para 2 who has CIN-  2 on colposcopic biopsy. The patient was seen for a LEEP evaluation on June 02, 2005 and watched the video, has no questions for today.   PROCEDURE:  The patient was placed in dorsal lithotomy. A Teflon coated  speculum was placed in the vagina. The cervix was visualized, was injected  with 8 mL of lidocaine. A medium Fischer was then used to do a LEEP  procedure. The bed of the LEEP was cauterized. The patient tolerated the  procedure well. Monsel's was used for hemostasis.   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   PLAN:  Follow-up Pap in 4 months.       TP/MEDQ  D:  06/30/2005  T:  06/30/2005  Job:  [id number]

## 2011-09-15 LAB — GC/CHLAMYDIA PROBE AMP, GENITAL: Chlamydia, DNA Probe: NEGATIVE

## 2011-09-15 LAB — WET PREP, GENITAL: Trich, Wet Prep: NONE SEEN

## 2011-09-15 LAB — URINALYSIS, ROUTINE W REFLEX MICROSCOPIC
Bilirubin Urine: NEGATIVE
Glucose, UA: NEGATIVE
Ketones, ur: NEGATIVE
pH: 6

## 2011-09-22 LAB — DIFFERENTIAL
Lymphocytes Relative: 26 % (ref 12–46)
Lymphs Abs: 1.9 10*3/uL (ref 0.7–4.0)
Neutro Abs: 4.8 10*3/uL (ref 1.7–7.7)
Neutrophils Relative %: 66 % (ref 43–77)

## 2011-09-22 LAB — CBC
HCT: 35.5 % — ABNORMAL LOW (ref 36.0–46.0)
Platelets: 236 10*3/uL (ref 150–400)
WBC: 7.3 10*3/uL (ref 4.0–10.5)

## 2011-09-22 LAB — POCT I-STAT, CHEM 8
BUN: 4 mg/dL — ABNORMAL LOW (ref 6–23)
Chloride: 107 mEq/L (ref 96–112)
Sodium: 142 mEq/L (ref 135–145)

## 2011-09-22 LAB — LIPASE, BLOOD: Lipase: 23 U/L (ref 11–59)

## 2011-09-22 LAB — POCT PREGNANCY, URINE: Preg Test, Ur: NEGATIVE

## 2011-09-27 LAB — CBC
HCT: 20.8 — ABNORMAL LOW
HCT: 27.2 — ABNORMAL LOW
HCT: 27.5 — ABNORMAL LOW
HCT: 28 — ABNORMAL LOW
Hemoglobin: 7 — CL
Hemoglobin: 9.2 — ABNORMAL LOW
Hemoglobin: 9.3 — ABNORMAL LOW
MCHC: 33.5
MCV: 67.7 — ABNORMAL LOW
MCV: 68.1 — ABNORMAL LOW
MCV: 68.2 — ABNORMAL LOW
MCV: 68.5 — ABNORMAL LOW
MCV: 67.9 — ABNORMAL LOW
Platelets: 195
Platelets: 232
RBC: 4
RBC: 4.04
RBC: 4.13
RDW: 18.8 — ABNORMAL HIGH
RDW: 19.3 — ABNORMAL HIGH
WBC: 10.6 — ABNORMAL HIGH
WBC: 11.9 — ABNORMAL HIGH
WBC: 12.4 — ABNORMAL HIGH
WBC: 12.9 — ABNORMAL HIGH

## 2011-09-27 LAB — POCT URINALYSIS DIP (DEVICE)
Bilirubin Urine: NEGATIVE
Bilirubin Urine: NEGATIVE
Glucose, UA: NEGATIVE
Glucose, UA: NEGATIVE
Hgb urine dipstick: NEGATIVE
Ketones, ur: NEGATIVE
Nitrite: NEGATIVE
Nitrite: NEGATIVE
Operator id: 159681
Urobilinogen, UA: 1
pH: 7

## 2011-09-27 LAB — CROSSMATCH

## 2011-09-27 LAB — COMPREHENSIVE METABOLIC PANEL
AST: 17
AST: 19
AST: 16
Albumin: 2.7 — ABNORMAL LOW
Alkaline Phosphatase: 128 — ABNORMAL HIGH
BUN: 4 — ABNORMAL LOW
BUN: 4 — ABNORMAL LOW
BUN: 2 — ABNORMAL LOW
CO2: 23
CO2: 24
Calcium: 8.8
Chloride: 106
Chloride: 107
Chloride: 107
Creatinine, Ser: 0.64
Creatinine, Ser: 0.66
Creatinine, Ser: 0.67
GFR calc Af Amer: >60
GFR calc Af Amer: >60
GFR calc Af Amer: >60
GFR calc non Af Amer: >60
GFR calc non Af Amer: >60
Potassium: 3.4 — ABNORMAL LOW
Total Bilirubin: 0.5
Total Bilirubin: 0.7
Total Bilirubin: 0.5
Total Protein: 6.5

## 2011-09-27 LAB — URINALYSIS, ROUTINE W REFLEX MICROSCOPIC
Nitrite: NEGATIVE
Nitrite: NEGATIVE
Protein, ur: 30 — AB
Specific Gravity, Urine: 1.02
Specific Gravity, Urine: <1.005 — ABNORMAL LOW
Urobilinogen, UA: 0.2
Urobilinogen, UA: 1
pH: 6

## 2011-09-27 LAB — STREP B DNA PROBE

## 2011-09-27 LAB — URINE MICROSCOPIC-ADD ON

## 2011-09-27 LAB — ABO/RH: ABO/RH(D): A POS

## 2011-09-27 LAB — RPR: RPR Ser Ql: NONREACTIVE

## 2011-09-27 LAB — URIC ACID: Uric Acid, Serum: 4.2

## 2011-09-28 LAB — POCT URINALYSIS DIP (DEVICE)
Hgb urine dipstick: NEGATIVE
Operator id: 120861
Operator id: 148111
Protein, ur: NEGATIVE
Protein, ur: NEGATIVE
Specific Gravity, Urine: 1.015
Specific Gravity, Urine: 1.02
Urobilinogen, UA: 1
Urobilinogen, UA: 2 — ABNORMAL HIGH
pH: 6.5

## 2011-09-29 LAB — POCT URINALYSIS DIP (DEVICE)
Hgb urine dipstick: NEGATIVE
Hgb urine dipstick: NEGATIVE
Operator id: 135281
Protein, ur: NEGATIVE
Protein, ur: NEGATIVE
Specific Gravity, Urine: 1.015
Specific Gravity, Urine: 1.02
Urobilinogen, UA: 0.2
Urobilinogen, UA: 1
pH: 7

## 2011-09-30 LAB — URINALYSIS, ROUTINE W REFLEX MICROSCOPIC
Glucose, UA: 100 — AB
Hgb urine dipstick: NEGATIVE
Ketones, ur: NEGATIVE
Protein, ur: NEGATIVE
pH: 7

## 2011-10-03 LAB — URINALYSIS, ROUTINE W REFLEX MICROSCOPIC
Bilirubin Urine: NEGATIVE
Glucose, UA: NEGATIVE
Ketones, ur: NEGATIVE
Specific Gravity, Urine: 1.01
pH: 6

## 2011-10-15 ENCOUNTER — Emergency Department (HOSPITAL_COMMUNITY)
Admission: EM | Admit: 2011-10-15 | Discharge: 2011-10-15 | Disposition: A | Discharge status: Home / Self Care | Payer: Medicaid Other | Attending: Emergency Medicine | Admitting: Emergency Medicine

## 2011-10-15 DIAGNOSIS — I10 Essential (primary) hypertension: Secondary | ICD-10-CM | POA: Insufficient documentation

## 2011-10-15 DIAGNOSIS — R10819 Abdominal tenderness, unspecified site: Secondary | ICD-10-CM | POA: Insufficient documentation

## 2011-10-15 DIAGNOSIS — R109 Unspecified abdominal pain: Secondary | ICD-10-CM | POA: Insufficient documentation

## 2011-10-15 DIAGNOSIS — N949 Unspecified condition associated with female genital organs and menstrual cycle: Secondary | ICD-10-CM | POA: Insufficient documentation

## 2011-10-15 DIAGNOSIS — R112 Nausea with vomiting, unspecified: Secondary | ICD-10-CM | POA: Insufficient documentation

## 2011-10-15 DIAGNOSIS — F3289 Other specified depressive episodes: Secondary | ICD-10-CM | POA: Insufficient documentation

## 2011-10-15 DIAGNOSIS — D573 Sickle-cell trait: Secondary | ICD-10-CM | POA: Insufficient documentation

## 2011-10-15 DIAGNOSIS — F329 Major depressive disorder, single episode, unspecified: Secondary | ICD-10-CM | POA: Insufficient documentation

## 2011-10-15 LAB — URINE MICROSCOPIC-ADD ON

## 2011-10-15 LAB — URINALYSIS, ROUTINE W REFLEX MICROSCOPIC
Hgb urine dipstick: NEGATIVE
Protein, ur: NEGATIVE mg/dL
Specific Gravity, Urine: 1.01 (ref 1.005–1.030)
Urobilinogen, UA: 1 mg/dL (ref 0.0–1.0)

## 2011-10-15 LAB — WET PREP, GENITAL: Clue Cells Wet Prep HPF POC: NONE SEEN

## 2011-10-16 ENCOUNTER — Emergency Department (HOSPITAL_COMMUNITY)
Admission: EM | Admit: 2011-10-16 | Discharge: 2011-10-17 | Disposition: A | Discharge status: Home / Self Care | Payer: Medicaid Other | Attending: Emergency Medicine | Admitting: Emergency Medicine

## 2011-10-16 DIAGNOSIS — M549 Dorsalgia, unspecified: Secondary | ICD-10-CM | POA: Insufficient documentation

## 2011-10-16 DIAGNOSIS — F3289 Other specified depressive episodes: Secondary | ICD-10-CM | POA: Insufficient documentation

## 2011-10-16 DIAGNOSIS — N739 Female pelvic inflammatory disease, unspecified: Secondary | ICD-10-CM | POA: Insufficient documentation

## 2011-10-16 DIAGNOSIS — N83209 Unspecified ovarian cyst, unspecified side: Secondary | ICD-10-CM | POA: Insufficient documentation

## 2011-10-16 DIAGNOSIS — A599 Trichomoniasis, unspecified: Secondary | ICD-10-CM | POA: Insufficient documentation

## 2011-10-16 DIAGNOSIS — I10 Essential (primary) hypertension: Secondary | ICD-10-CM | POA: Insufficient documentation

## 2011-10-16 DIAGNOSIS — F329 Major depressive disorder, single episode, unspecified: Secondary | ICD-10-CM | POA: Insufficient documentation

## 2011-10-16 DIAGNOSIS — R112 Nausea with vomiting, unspecified: Secondary | ICD-10-CM | POA: Insufficient documentation

## 2011-10-16 DIAGNOSIS — D573 Sickle-cell trait: Secondary | ICD-10-CM | POA: Insufficient documentation

## 2011-10-17 ENCOUNTER — Emergency Department (HOSPITAL_COMMUNITY): Payer: Medicaid Other

## 2011-10-17 LAB — URINE MICROSCOPIC-ADD ON

## 2011-10-17 LAB — POCT I-STAT, CHEM 8
BUN: 8 mg/dL (ref 6–23)
Calcium, Ion: 1.17 mmol/L (ref 1.12–1.32)
Chloride: 105 mEq/L (ref 96–112)
Creatinine, Ser: 1 mg/dL (ref 0.50–1.10)
Glucose, Bld: 121 mg/dL — ABNORMAL HIGH (ref 70–99)
HCT: 34 % — ABNORMAL LOW (ref 36.0–46.0)
Potassium: 3.6 mEq/L (ref 3.5–5.1)

## 2011-10-17 LAB — DIFFERENTIAL
Basophils Relative: 0 % (ref 0–1)
Eosinophils Relative: 0 % (ref 0–5)
Lymphs Abs: 1.1 10*3/uL (ref 0.7–4.0)
Monocytes Absolute: 0.5 10*3/uL (ref 0.1–1.0)
Monocytes Relative: 5 % (ref 3–12)
Neutro Abs: 8.1 10*3/uL — ABNORMAL HIGH (ref 1.7–7.7)

## 2011-10-17 LAB — GC/CHLAMYDIA PROBE AMP, GENITAL
Chlamydia, DNA Probe: NEGATIVE
GC Probe Amp, Genital: NEGATIVE

## 2011-10-17 LAB — CBC
HCT: 30.9 % — ABNORMAL LOW (ref 36.0–46.0)
Hemoglobin: 9.8 g/dL — ABNORMAL LOW (ref 12.0–15.0)
MCH: 21.7 pg — ABNORMAL LOW (ref 26.0–34.0)
MCHC: 31.7 g/dL (ref 30.0–36.0)
MCV: 68.4 fL — ABNORMAL LOW (ref 78.0–100.0)
RDW: 17.5 % — ABNORMAL HIGH (ref 11.5–15.5)

## 2011-10-17 LAB — URINALYSIS, ROUTINE W REFLEX MICROSCOPIC
Bilirubin Urine: NEGATIVE
Glucose, UA: NEGATIVE mg/dL
Hgb urine dipstick: NEGATIVE
Protein, ur: NEGATIVE mg/dL
Urobilinogen, UA: 0.2 mg/dL (ref 0.0–1.0)

## 2011-11-07 IMAGING — US US OB FOLLOW-UP
1 series · 14 of 28 positions shown · non-contrast
Comparison: none

OBSTETRICAL ULTRASOUND:
 This ultrasound exam was performed in the [HOSPITAL] Ultrasound Department.  The OB US report was generated in the AS system, and faxed to the ordering physician.  This report is also available in [HOSPITAL]?s [employer] and in [REDACTED] PACS.

[Series 1: us ob follow up · 14 of 39 slices shown]
[im 2/39]
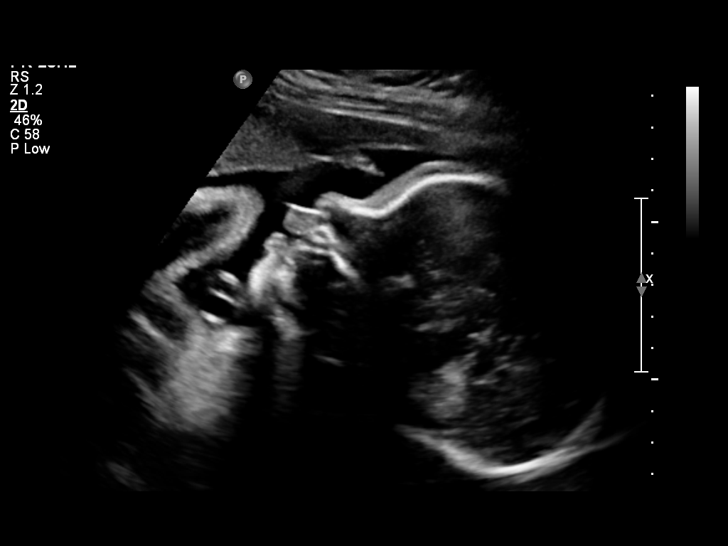
[im 5/39]
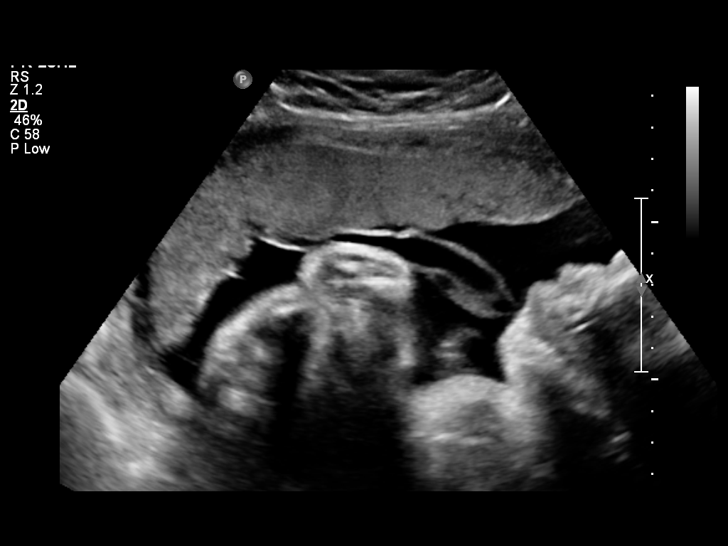
[im 8/39]
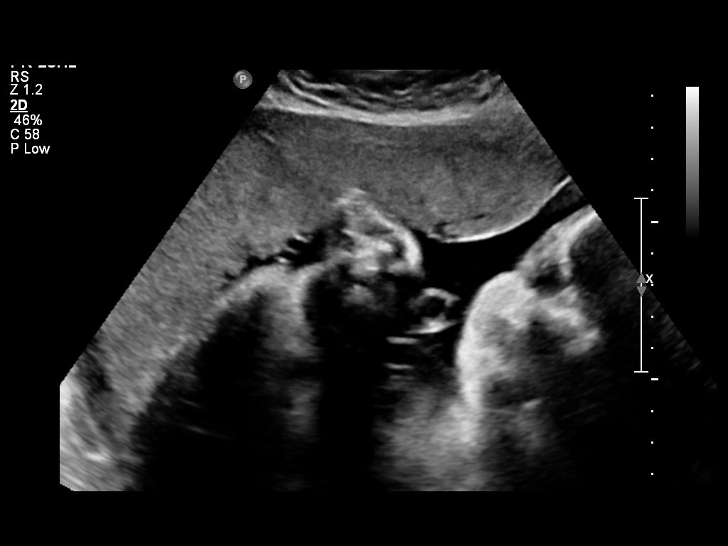
[im 10/39]
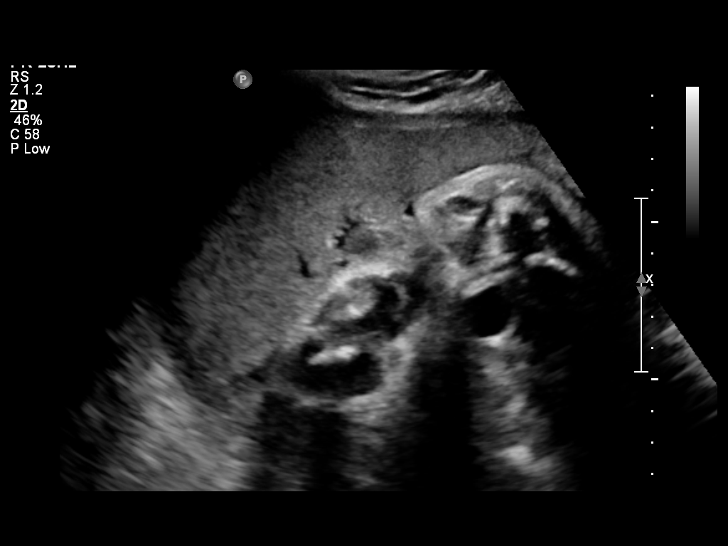
[im 13/39]
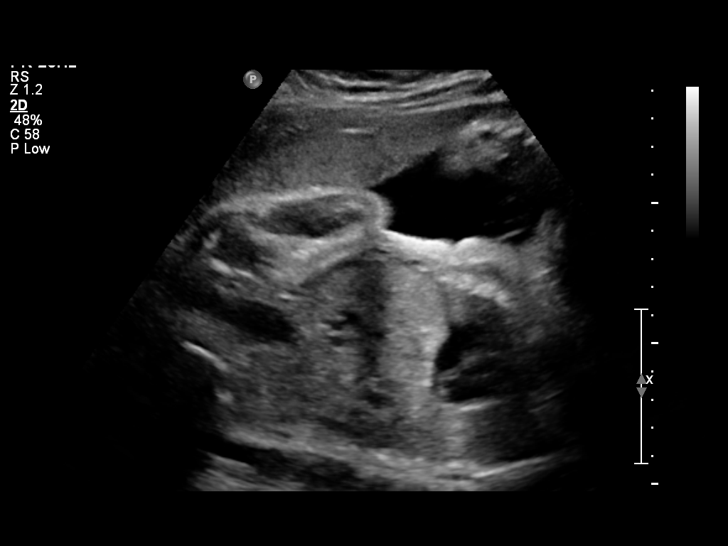
[im 16/39]
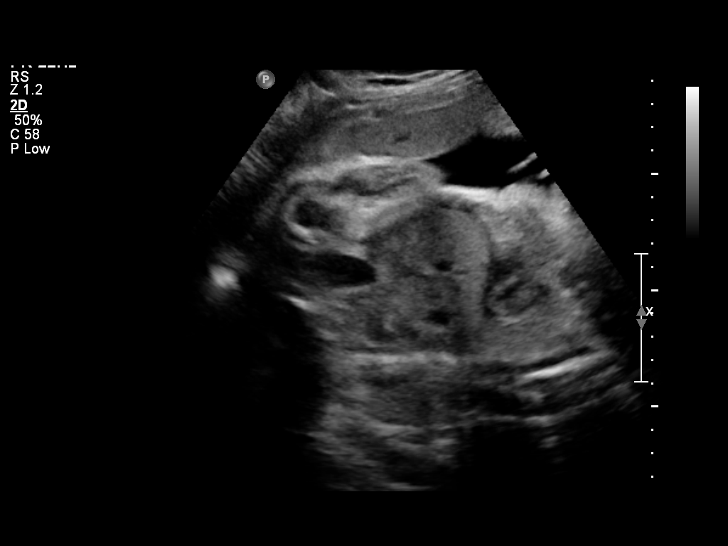
[im 19/39]
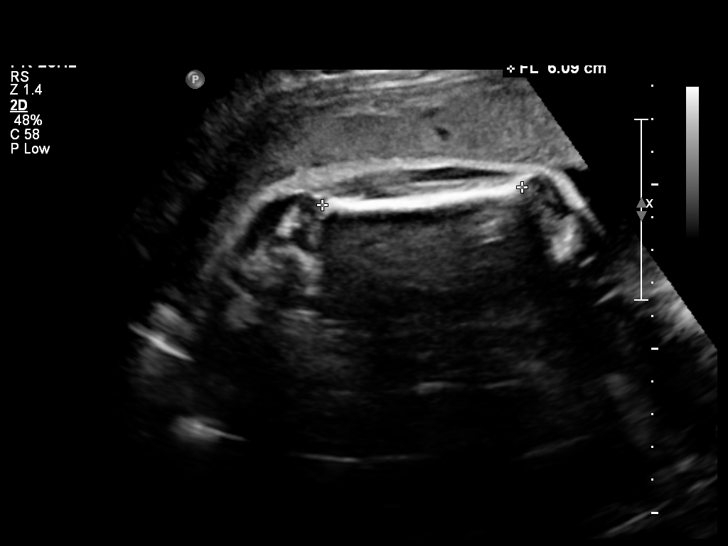
[im 22/39]
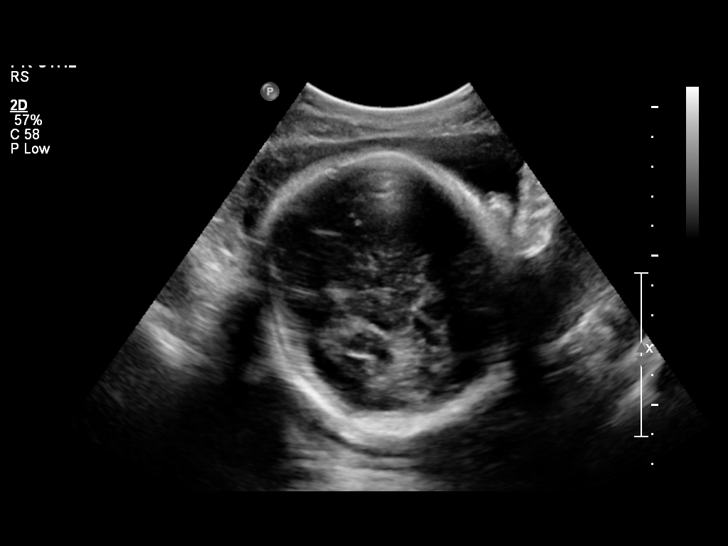
[im 24/39]
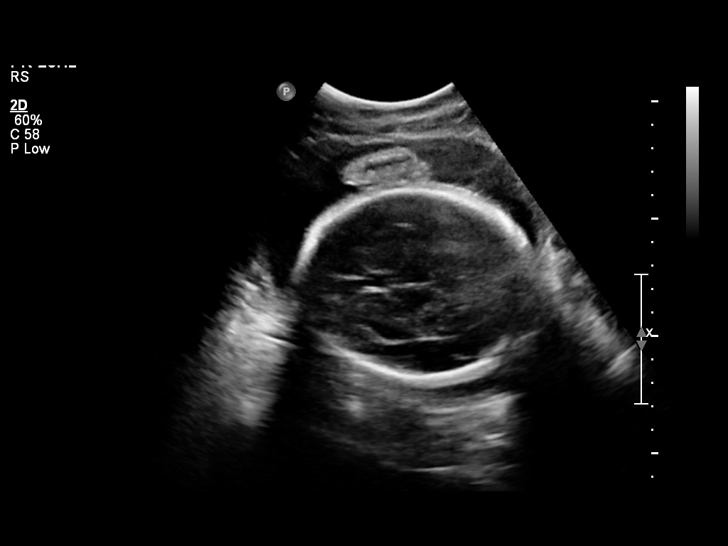
[im 27/39]
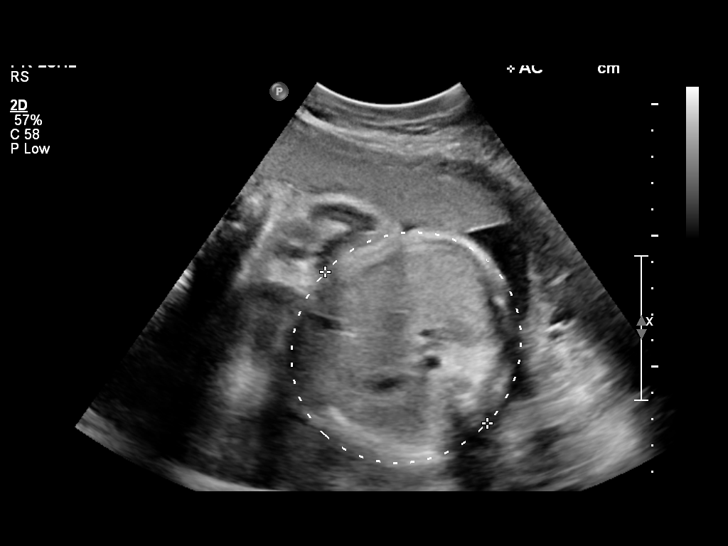
[im 30/39]
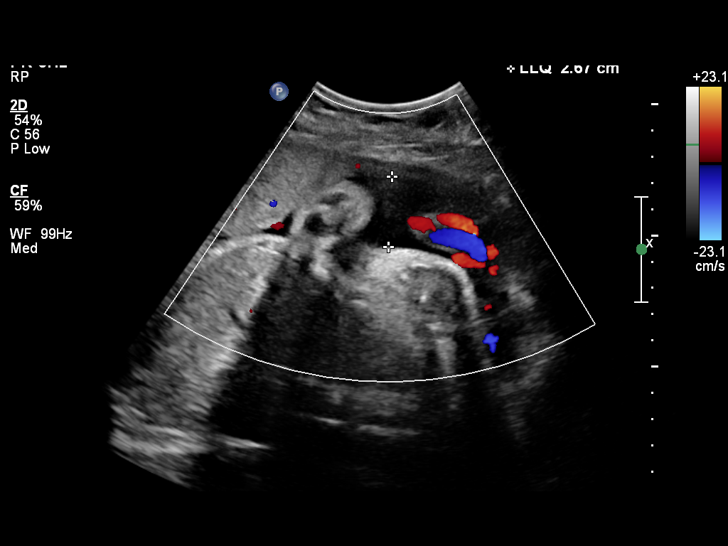
[im 33/39]
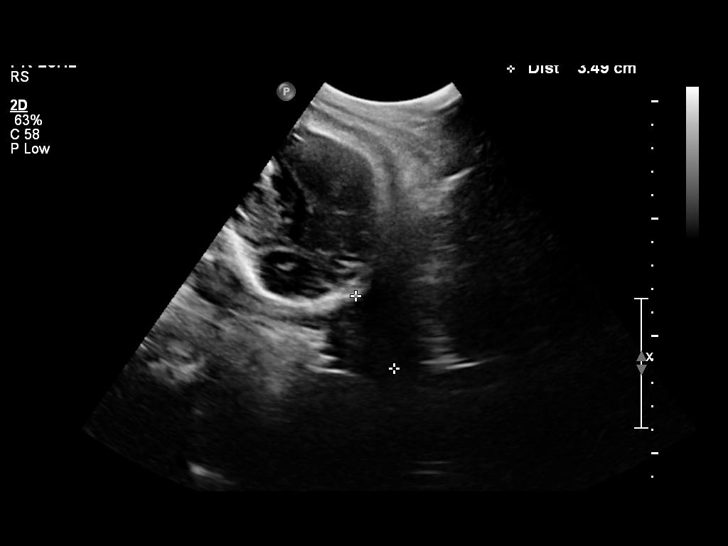
[im 36/39]
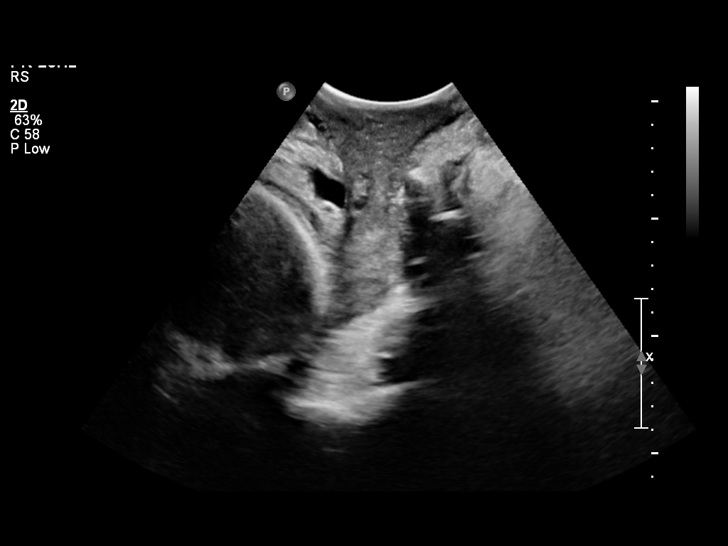
[im 39/39]
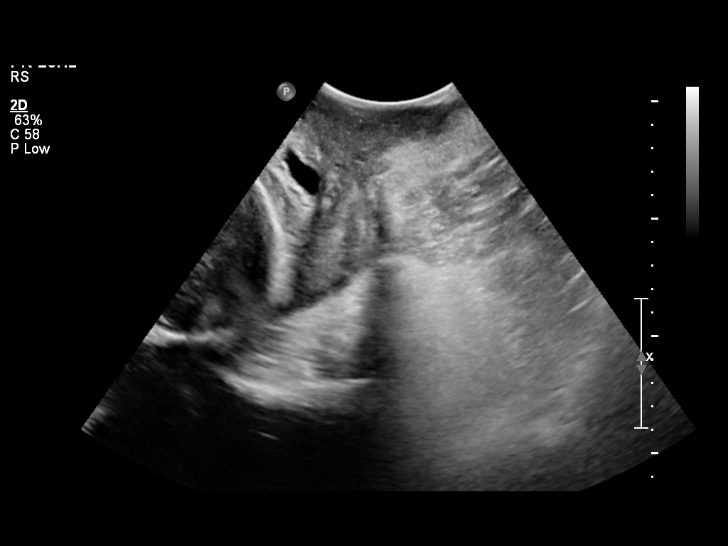

[14 of 28 positions shown; findings below may reference images not displayed]

IMPRESSION: See AS Obstetric US report.

## 2011-11-23 IMAGING — US US OB FOLLOW-UP
1 series · 14 of 20 positions shown · non-contrast
Comparison: none

OBSTETRICAL ULTRASOUND:
 This ultrasound exam was performed in the [HOSPITAL] Ultrasound Department.  The OB US report was generated in the AS system, and faxed to the ordering physician.  This report is also available in [HOSPITAL]?s [employer] and in [REDACTED] PACS.

[Series 1: us ob follow up · 0.33mm/px · 14 of 20 slices shown]
[im 1/20]
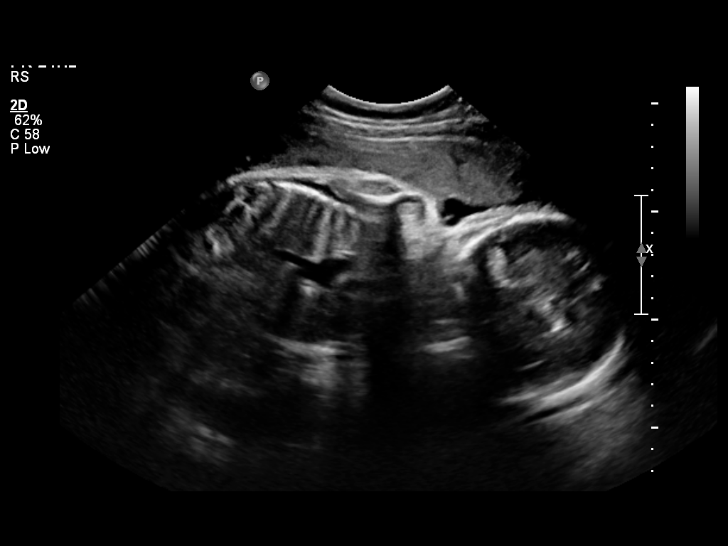
[im 3/20]
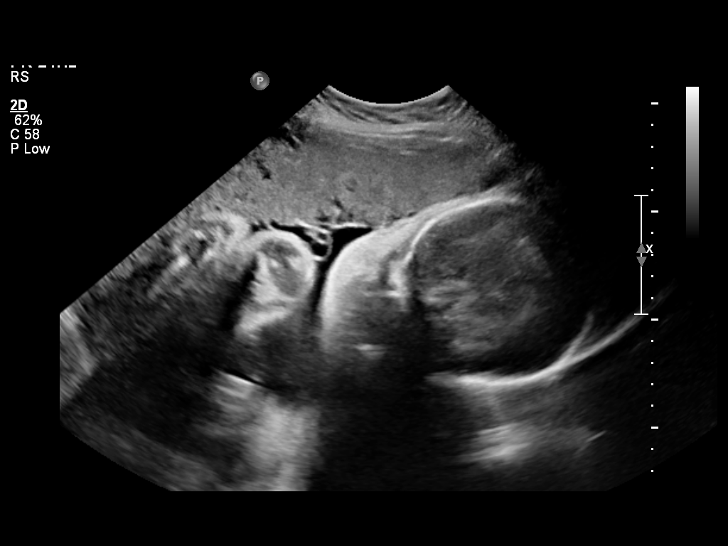
[im 4/20]
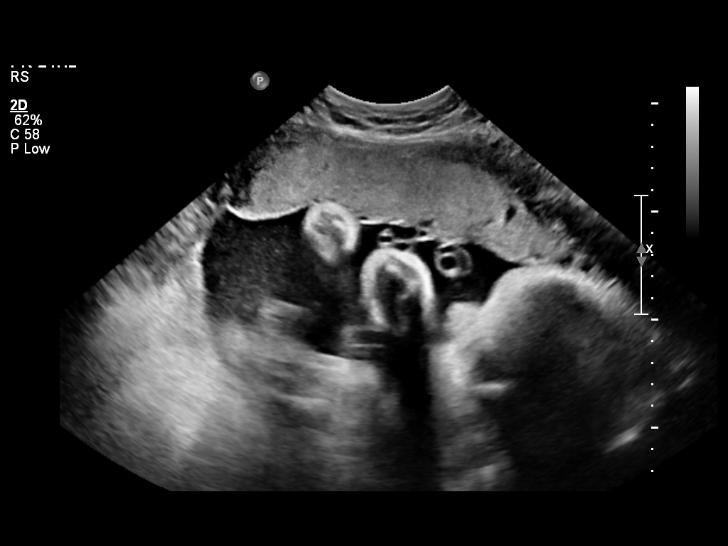
[im 6/20]
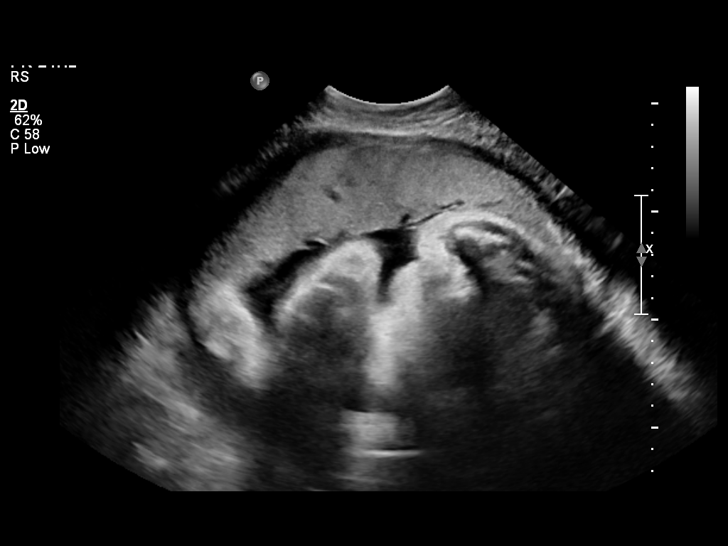
[im 7/20]
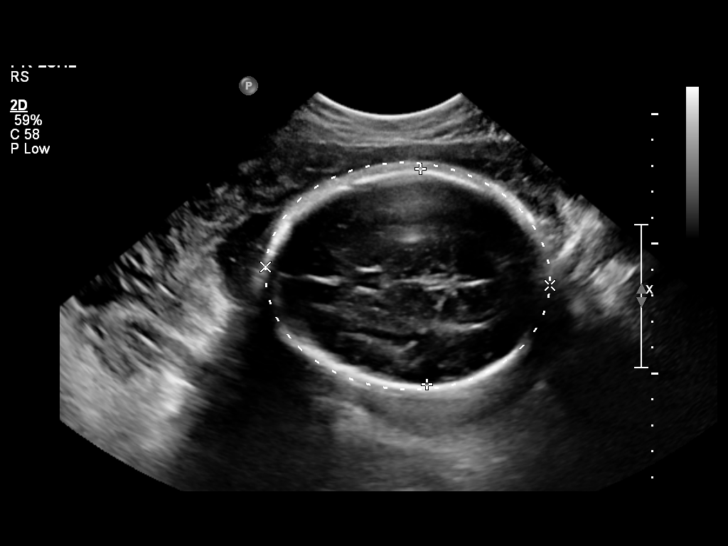
[im 8/20]
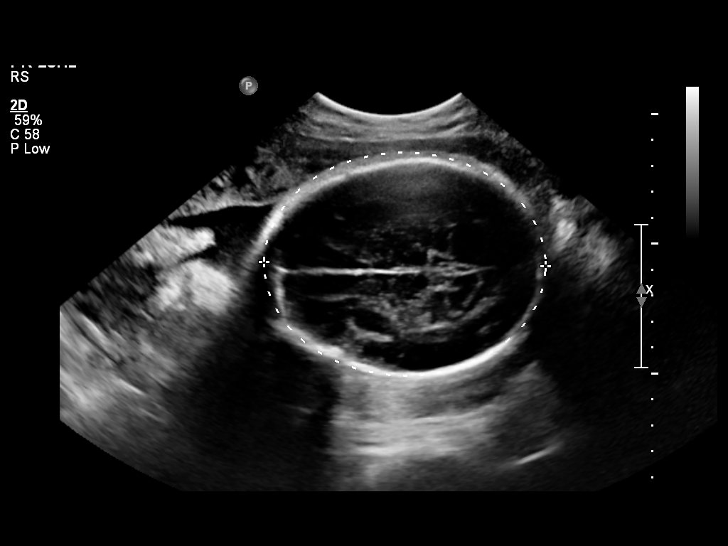
[im 10/20]
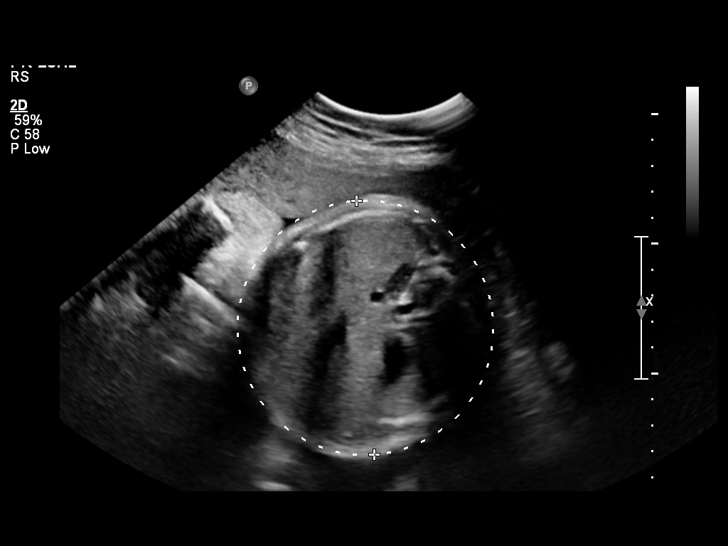
[im 11/20]
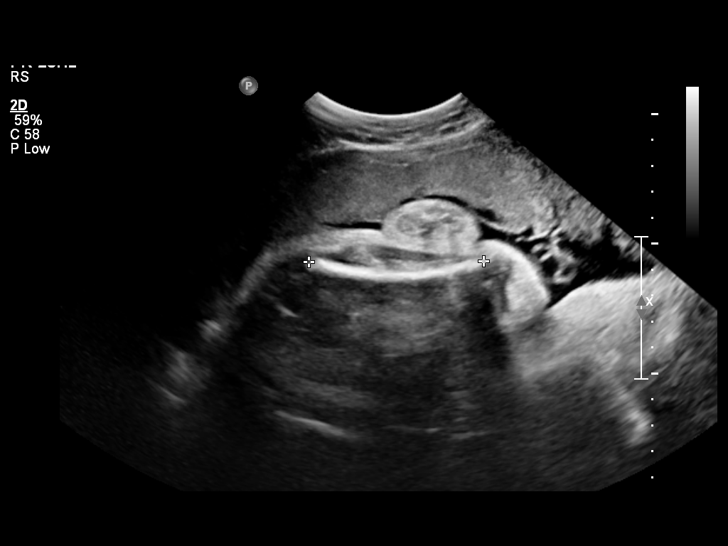
[im 13/20]
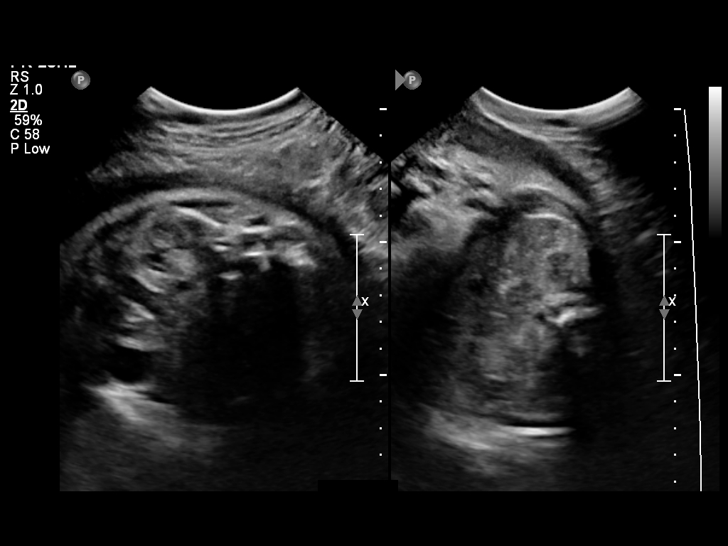
[im 14/20]
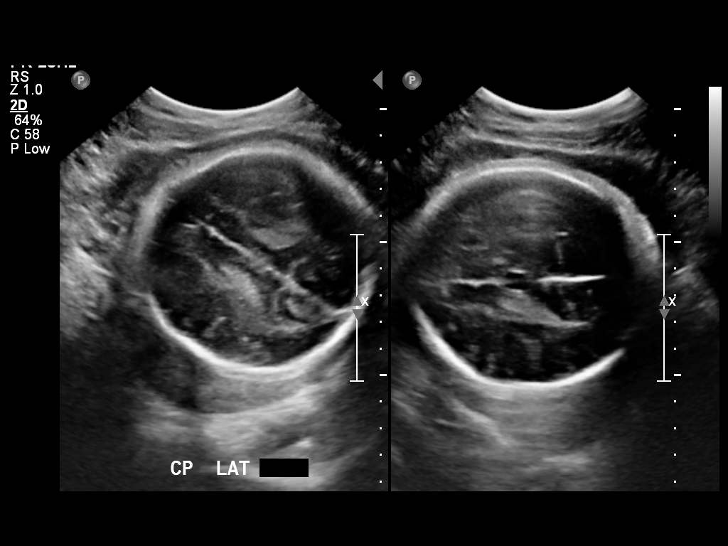
[im 16/20]
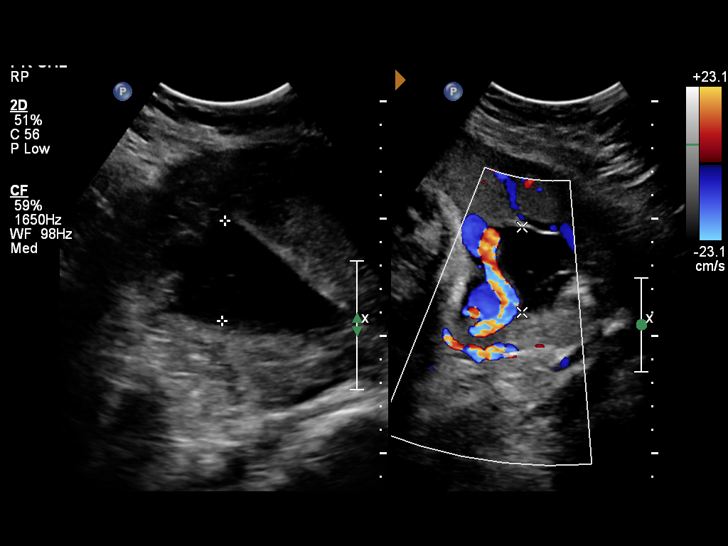
[im 17/20]
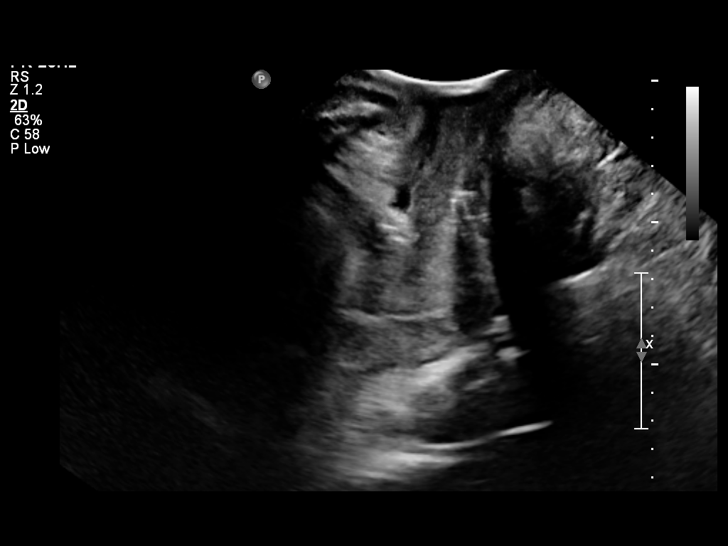
[im 18/20]
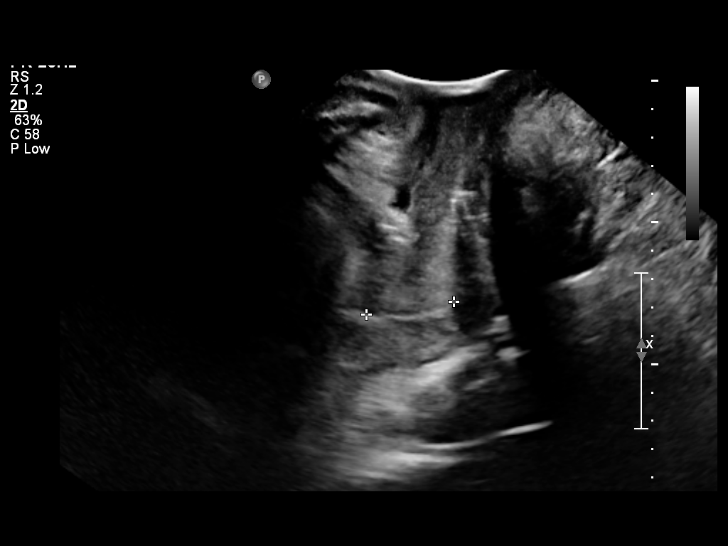
[im 20/20]
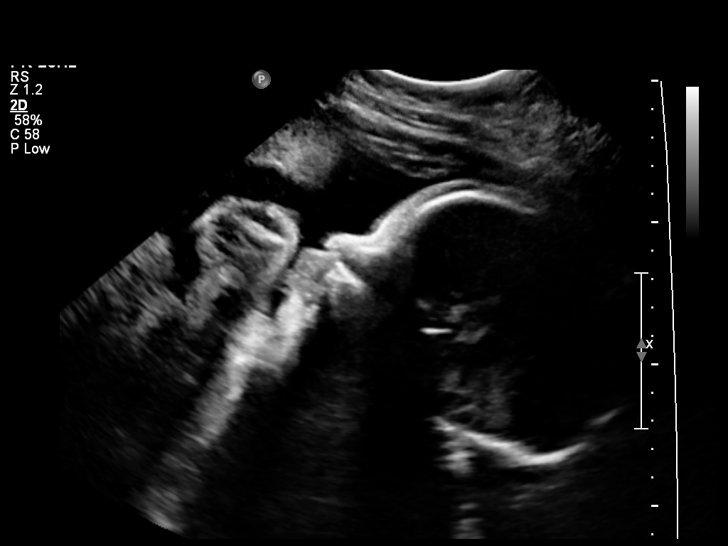

[14 of 20 positions shown; findings below may reference images not displayed]

IMPRESSION: See AS Obstetric US report.

## 2012-02-24 ENCOUNTER — Encounter (HOSPITAL_COMMUNITY): Payer: Self-pay | Admitting: *Deleted

## 2012-02-24 ENCOUNTER — Emergency Department (HOSPITAL_COMMUNITY)
Admission: EM | Admit: 2012-02-24 | Discharge: 2012-02-24 | Disposition: A | Discharge status: Home / Self Care | Payer: Medicaid Other | Source: Home / Self Care

## 2012-02-24 DIAGNOSIS — K529 Noninfective gastroenteritis and colitis, unspecified: Secondary | ICD-10-CM

## 2012-02-24 DIAGNOSIS — K5289 Other specified noninfective gastroenteritis and colitis: Secondary | ICD-10-CM

## 2012-02-24 HISTORY — DX: Essential (primary) hypertension: I10

## 2012-02-24 MED ORDER — ONDANSETRON HCL 8 MG PO TABS: 8.0000 mg | ORAL_TABLET | Freq: Three times a day (TID) | ORAL | Status: AC | PRN

## 2012-02-24 NOTE — Discharge Instructions (Signed)
Thank you for coming in today. You should start feeling better soon.  Use the ondansetron as needed for vomiting.  Keep drinking fluids.  Go to the emergency room for bad belly pain, uncontrollable vomiting, or high fever.

## 2012-02-24 NOTE — ED Notes (Signed)
DIARRHEA    AND  VOMITING  X  2  DAYS   SHE  REPORTS  HER CHILD  HAS  SIMILAR  SYMPTOMS  AS  WELL       -  PT  HAS  NOT VOMITED  IN  ABOUT 8  HOURS  -      SHE  APPEARS  COMFORTABLE  SITTING  UPRIGHT ON EXAM  TABLE  AS  WELL   SPEAKING IN  COMPLETE  SENTANCES

## 2012-02-24 NOTE — ED Provider Notes (Signed)
[name] is a 36 y.o. female who presents to Urgent Care today for 2 days of diarrhea and vomiting with abdominal cramping. No fevers chills cough congestion abdominal pain or blood in the vomit or diarrhea. Her 80-year-old child was recently sick with a diarrheal illness. She feels well otherwise.   PMH reviewed. Otherwise healthy ROS as above otherwise neg Medications reviewed. No current facility-administered medications for this encounter.   Current Outpatient Prescriptions  Medication Sig Dispense Refill  . bismuth subsalicylate (PEPTO BISMOL) 262 MG/15ML suspension Take 15 mLs by mouth every 6 (six) hours as needed.      . ondansetron (ZOFRAN) 8 MG tablet Take 1 tablet (8 mg total) by mouth every 8 (eight) hours as needed for nausea.  20 tablet  0    Exam:  BP 153/91  Pulse 70  Temp(Src) 98.3 F (36.8 C) (Oral)  Resp 18  SpO2 100%  LMP 02/18/2012 Gen: Well NAD, nontoxic HEENT: EOMI,  MMM Lungs: CTABL Nl WOB Heart: RRR no MRG Abd: NABS, NT, ND Exts: Non edematous BL  LE, warm and well perfused.   Assessment and Plan: 36 year old woman with likely viral gastroenteritis. Serious intra-abdominal pathology very unlikely given soft nontender abdomen and in the neck viral gastroenteritis currently in the community.  Plan for symptomatic management with Zofran and Pepto-Bismol.  Discussed warning signs for followup of the emergency room. Please see patient handout. Followup as needed.     Rodolph Bong, MD 02/24/12 2053

## 2012-02-25 NOTE — ED Provider Notes (Signed)
Medical screening examination/treatment/procedure(s) were performed by a resident physician and as supervising physician I was immediately available for consultation/collaboration.  Leslee Home, M.D.   Reuben Likes, MD 02/25/12 [phone]

## 2012-12-22 ENCOUNTER — Emergency Department (HOSPITAL_COMMUNITY)
Admission: EM | Admit: 2012-12-22 | Discharge: 2012-12-22 | Disposition: A | Discharge status: Home / Self Care | Payer: Self-pay | Attending: Emergency Medicine | Admitting: Emergency Medicine

## 2012-12-22 ENCOUNTER — Emergency Department (HOSPITAL_COMMUNITY): Payer: Self-pay

## 2012-12-22 ENCOUNTER — Encounter (HOSPITAL_COMMUNITY): Payer: Self-pay | Admitting: Nurse Practitioner

## 2012-12-22 DIAGNOSIS — S93409A Sprain of unspecified ligament of unspecified ankle, initial encounter: Secondary | ICD-10-CM | POA: Insufficient documentation

## 2012-12-22 DIAGNOSIS — W010XXA Fall on same level from slipping, tripping and stumbling without subsequent striking against object, initial encounter: Secondary | ICD-10-CM | POA: Insufficient documentation

## 2012-12-22 DIAGNOSIS — Y9289 Other specified places as the place of occurrence of the external cause: Secondary | ICD-10-CM | POA: Insufficient documentation

## 2012-12-22 DIAGNOSIS — I10 Essential (primary) hypertension: Secondary | ICD-10-CM | POA: Insufficient documentation

## 2012-12-22 DIAGNOSIS — E119 Type 2 diabetes mellitus without complications: Secondary | ICD-10-CM | POA: Insufficient documentation

## 2012-12-22 DIAGNOSIS — Y9389 Activity, other specified: Secondary | ICD-10-CM | POA: Insufficient documentation

## 2012-12-22 DIAGNOSIS — F17200 Nicotine dependence, unspecified, uncomplicated: Secondary | ICD-10-CM | POA: Insufficient documentation

## 2012-12-22 HISTORY — DX: Type 2 diabetes mellitus without complications: E11.9

## 2012-12-22 MED ORDER — NAPROXEN 500 MG PO TABS: 500.0000 mg | ORAL_TABLET | Freq: Two times a day (BID) | ORAL | Status: DC | PRN

## 2012-12-22 MED ORDER — OXYCODONE-ACETAMINOPHEN 5-325 MG PO TABS
2.0000 | ORAL_TABLET | Freq: Once | ORAL | Status: AC
  Administered 2012-12-22: 2 via ORAL
  Filled 2012-12-22: qty 2

## 2012-12-22 MED ORDER — IBUPROFEN 400 MG PO TABS
600.0000 mg | ORAL_TABLET | Freq: Once | ORAL | Status: AC
  Administered 2012-12-22: 600 mg via ORAL
  Filled 2012-12-22: qty 1

## 2012-12-22 NOTE — ED Provider Notes (Signed)
History   This chart was scribed for Raeford Razor, MD by Sofie Rower, ED Scribe. The patient was seen in room TR06C/TR06C and the patient's care was started at 4:26PM.     CSN: [id number]  Arrival date & time 12/22/12  1519   None     Chief Complaint  Patient presents with  . Ankle Pain    (Consider location/radiation/quality/duration/timing/severity/associated sxs/prior treatment) Patient is a 37 y.o. female presenting with ankle pain. The history is provided by the patient. No language interpreter was used.  Ankle Pain  The incident occurred yesterday. The incident occurred in the street [employer]. ). The injury mechanism was a fall. The pain is present in the right ankle. The quality of the pain is described as aching. The pain is moderate. The pain has been constant since onset. Associated symptoms include inability to bear weight. Pertinent negatives include no numbness and no tingling. She reports no foreign bodies present. The symptoms are aggravated by activity and bearing weight. She has tried acetaminophen for the symptoms. The treatment provided no relief.    [name] is a 37 y.o. female , with a hx of hypertension and diabetes, who presents to the Emergency Department complaining of sudden, progressively worsening, ankle pain located at the right ankle, onset yesterday (12/21/12).  Associated symptoms include swelling located at the right ankle. The pt reports she was exiting the family dollar store, where she was carrying a large amount of water, and suddenly mis-stepped, rolling her right ankle. The pt has taken tylenol PTA, however, it does not provide relief of her ankle pain. Modifying factors include weight bearing and ambulation which intensifies the ankle pain.   Past Medical History  Diagnosis Date  . Hypertension   . Diabetes mellitus without complication     Past Surgical History  Procedure Date  . Btl   . Leep     History reviewed. No pertinent  family history.  History  Substance Use Topics  . Smoking status: Current Some Day Smoker  . Smokeless tobacco: Not on file  . Alcohol Use: No    OB History    Grav Para Term Preterm Abortions TAB SAB Ect Mult Living                  Review of Systems  Musculoskeletal: Positive for arthralgias.  Neurological: Negative for tingling and numbness.  All other systems reviewed and are negative.    Allergies  Review of patient's allergies indicates no known allergies.  Home Medications   Current Outpatient Rx  Name  Route  Sig  Dispense  Refill  . BISMUTH SUBSALICYLATE 262 MG/15ML PO SUSP   Oral   Take 15 mLs by mouth every 6 (six) hours as needed.           BP 148/87  Pulse 98  Temp 98.4 F (36.9 C) (Oral)  Resp 15  SpO2 100%  LMP 12/03/2012  Physical Exam  Nursing note and vitals reviewed. Constitutional: She appears well-developed and well-nourished. No distress.  HENT:  Head: Normocephalic and atraumatic.  Eyes: Conjunctivae normal are normal. Right eye exhibits no discharge. Left eye exhibits no discharge.  Neck: Neck supple.  Cardiovascular: Normal rate, regular rhythm and normal heart sounds.  Exam reveals no gallop and no friction rub.   No murmur heard. Pulmonary/Chest: Effort normal and breath sounds normal. No respiratory distress.  Abdominal: Soft. She exhibits no distension. There is no tenderness.  Musculoskeletal: She exhibits no edema and  no tenderness.       Right ankle: She exhibits swelling. tenderness. Lateral malleolus tenderness found.       Skin intact. Neurovascularly intact distally. No tenderness to proximal tib fib.   Neurological: She is alert.  Skin: Skin is warm and dry.  Psychiatric: She has a normal mood and affect. Her behavior is normal. Thought content normal.    ED Course  Procedures (including critical care time)  DIAGNOSTIC STUDIES: Oxygen Saturation is 100% on room air, normal by my interpretation.    COORDINATION  OF CARE:   4:28 PM- Treatment plan discussed with patient. Pt agrees with treatment.     Labs Reviewed - No data to display Dg Ankle Complete Right  12/22/2012  *RADIOLOGY REPORT*  Clinical Data: Twisted ankle  RIGHT ANKLE - COMPLETE 3+ VIEW  Comparison: None.  Findings: Normal alignment and no fracture.  No significant degenerative change.  Lateral soft tissue swelling is present. There is a small joint effusion.  IMPRESSION: Negative for fracture.   Original Report Authenticated By: Janeece Riggers, M.D.      1. Ankle sprain       MDM  37 year old female with ankle sprain. Imaging negative for acute osseous injury. Plan symptomatic treatment with rest, ice, elevation and when necessary NSAIDs.  return precautions discussed.     I personally preformed the services scribed in my presence. The recorded information has been reviewed is accurate. Raeford Razor, MD.   Raeford Razor, MD 12/25/12 2350

## 2012-12-22 NOTE — ED Notes (Signed)
Pt tripped yesterday and injured R ankle, c/o severe pain since. Ambulatory, mae.

## 2012-12-22 NOTE — Progress Notes (Signed)
Orthopedic Tech Progress Note Patient Details:  [name] Nov 19, 1976 [id number] Right ankle ASO applied. Application tolerated well by patient. Nurse in treatment room during application to administer drugs and provide patient with discharge information.  Ortho Devices Type of Ortho Device: ASO Ortho Device/Splint Location: Right Ortho Device/Splint Interventions: Application   Asia R Creger 12/22/2012, 4:46 PM

## 2013-07-05 ENCOUNTER — Encounter (HOSPITAL_COMMUNITY): Payer: Self-pay | Admitting: *Deleted

## 2013-07-05 DIAGNOSIS — F17200 Nicotine dependence, unspecified, uncomplicated: Secondary | ICD-10-CM | POA: Insufficient documentation

## 2013-07-05 DIAGNOSIS — IMO0001 Reserved for inherently not codable concepts without codable children: Secondary | ICD-10-CM | POA: Insufficient documentation

## 2013-07-05 DIAGNOSIS — I10 Essential (primary) hypertension: Secondary | ICD-10-CM | POA: Insufficient documentation

## 2013-07-05 DIAGNOSIS — R5381 Other malaise: Secondary | ICD-10-CM | POA: Insufficient documentation

## 2013-07-05 DIAGNOSIS — R51 Headache: Secondary | ICD-10-CM | POA: Insufficient documentation

## 2013-07-05 DIAGNOSIS — Z3202 Encounter for pregnancy test, result negative: Secondary | ICD-10-CM | POA: Insufficient documentation

## 2013-07-05 DIAGNOSIS — R5383 Other fatigue: Secondary | ICD-10-CM | POA: Insufficient documentation

## 2013-07-05 LAB — URINALYSIS, ROUTINE W REFLEX MICROSCOPIC
Glucose, UA: NEGATIVE mg/dL
Hgb urine dipstick: NEGATIVE
Ketones, ur: NEGATIVE mg/dL
Protein, ur: NEGATIVE mg/dL
pH: 6.5 (ref 5.0–8.0)

## 2013-07-05 LAB — GLUCOSE, CAPILLARY: Glucose-Capillary: 87 mg/dL (ref 70–99)

## 2013-07-05 NOTE — ED Notes (Signed)
Reports having headache for several days, today having fatigue, feeling sluggish, frequent urination.

## 2013-07-06 ENCOUNTER — Emergency Department (HOSPITAL_COMMUNITY)
Admission: EM | Admit: 2013-07-06 | Discharge: 2013-07-06 | Disposition: A | Discharge status: Home / Self Care | Payer: Self-pay | Attending: Emergency Medicine | Admitting: Emergency Medicine

## 2013-07-06 DIAGNOSIS — E1165 Type 2 diabetes mellitus with hyperglycemia: Secondary | ICD-10-CM

## 2013-07-06 DIAGNOSIS — I10 Essential (primary) hypertension: Secondary | ICD-10-CM

## 2013-07-06 LAB — GLUCOSE, CAPILLARY: Glucose-Capillary: 115 mg/dL — ABNORMAL HIGH (ref 70–99)

## 2013-07-06 MED ORDER — PAROXETINE HCL 20 MG PO TABS: 10.0000 mg | ORAL_TABLET | ORAL | Status: DC

## 2013-07-06 MED ORDER — SODIUM CHLORIDE 0.9 % IV BOLUS (SEPSIS)
1000.0000 mL | Freq: Once | INTRAVENOUS | Status: AC
  Administered 2013-07-06: 1000 mL via INTRAVENOUS

## 2013-07-06 MED ORDER — OXYCODONE-ACETAMINOPHEN 5-325 MG PO TABS: 2.0000 | ORAL_TABLET | ORAL | Status: DC | PRN

## 2013-07-06 MED ORDER — METFORMIN HCL 1000 MG PO TABS: 1000.0000 mg | ORAL_TABLET | Freq: Two times a day (BID) | ORAL | Status: DC

## 2013-07-06 MED ORDER — TRAMADOL HCL 50 MG PO TABS
50.0000 mg | ORAL_TABLET | Freq: Once | ORAL | Status: AC
  Administered 2013-07-06: 50 mg via ORAL
  Filled 2013-07-06: qty 1

## 2013-07-06 MED ORDER — AMLODIPINE BESYLATE 10 MG PO TABS: 10.0000 mg | ORAL_TABLET | Freq: Every day | ORAL | Status: DC

## 2013-07-06 MED ORDER — ACETAMINOPHEN 325 MG PO TABS
650.0000 mg | ORAL_TABLET | Freq: Once | ORAL | Status: AC
  Administered 2013-07-06: 650 mg via ORAL
  Filled 2013-07-06: qty 2

## 2013-07-06 MED ORDER — ONDANSETRON HCL 4 MG/2ML IJ SOLN
4.0000 mg | Freq: Once | INTRAMUSCULAR | Status: AC
  Administered 2013-07-06: 4 mg via INTRAVENOUS
  Filled 2013-07-06: qty 2

## 2013-07-06 NOTE — ED Notes (Signed)
CBG: 115 

## 2013-07-06 NOTE — ED Provider Notes (Signed)
History    CSN: [id number] Arrival date & time 07/05/13  1843  First MD Initiated Contact with Patient 07/06/13 0056     Chief Complaint  Patient presents with  . Headache  . Fatigue   (Consider location/radiation/quality/duration/timing/severity/associated sxs/prior Treatment) HPI Patient is a very pleasant 37 year old woman with currently untreated diabetes and hypertension. She presented with complaints of diffuse headache a feeling of fatigue. As I talked to the patient, she became tearful and it seems that she is also depressed and overwhelmed as a single mother of 4 children. The patient is working 2 jobs. She is currently uninsured. Thus, she has not been able to obtain prescription medications.  The patient says her headache is in a bandlike distribution and radiates into her neck. Her headache has been present for the last 3 days. She denies any focal neurologic deficits. She does endorse some photophobia and phonophobia. She feels best when laying any dark room.. She denies nausea vomiting no fever.  Patient does not check her blood glucose level. However, she denies polyuria & polydipsia. NO CP or SOB.  Past Medical History  Diagnosis Date  . Hypertension   . Diabetes mellitus without complication    Past Surgical History  Procedure Laterality Date  . Btl    . Leep     History reviewed. No pertinent family history. History  Substance Use Topics  . Smoking status: Current Some Day Smoker  . Smokeless tobacco: Not on file  . Alcohol Use: No   OB History   Grav Para Term Preterm Abortions TAB SAB Ect Mult Living                 Review of Systems Gen: no weight loss, fevers, chills, night sweats Eyes: no discharge or drainage, no occular pain or visual changes Nose: no epistaxis or rhinorrhea Mouth: no dental pain, no sore throat Neck: As per history of present illness, otherwise negative Lungs: no SOB, cough, wheezing CV: no chest pain, palpitations,  dependent edema or orthopnea Abd: no abdominal pain, nausea, vomiting GU: no dysuria or gross hematuria MSK: no myalgias or arthralgias Neuro: History of present illness, otherwise negative Skin: no rash Psyche: As per history of present illness, the patient denies suicidal ideation. No delusions. Thought of harming others.  Allergies  Review of patient's allergies indicates no known allergies.  Home Medications   Current Outpatient Rx  Name  Route  Sig  Dispense  Refill  . acetaminophen (TYLENOL) 500 MG tablet   Oral   Take 500 mg by mouth every 6 (six) hours as needed for pain.          BP 153/87  Pulse 83  Temp(Src) 98.7 F (37.1 C) (Oral)  Resp 16  SpO2 99%  LMP 06/30/2013 Physical Exam Gen: well developed and well nourished appearing and in no acute distress laying on gurney in darkened ramp. Head: NCAT Eyes: PERL, EOMI Nose: no epistaixis or rhinorrhea Mouth/throat: mucosa is moist and pink Neck: supple, no stridor Lungs: CTA B, no wheezing, rhonchi or rales Heart-regular rate and rhythm, no murmur Abd: soft, notender, the nondistended Back: no ttp, no cva ttp Skin: no rashese, wnl Neuro: CN ii-xii grossly intact, no focal deficits Psyche; tearful, cooperative. Patient is consolable and seems to have a relieved affect after sharing her stressors  ED Course  Procedures (including critical care time)  Results for orders placed during the hospital encounter of 07/06/13 (from the past 48 hour(s))  URINALYSIS, ROUTINE W  REFLEX MICROSCOPIC     Status: None   Collection Time    07/05/13  6:59 PM      Result Value Range   Color, Urine YELLOW  YELLOW   APPearance CLEAR  CLEAR   Specific Gravity, Urine 1.010  1.005 - 1.030   pH 6.5  5.0 - 8.0   Glucose, UA NEGATIVE  NEGATIVE mg/dL   Hgb urine dipstick NEGATIVE  NEGATIVE   Bilirubin Urine NEGATIVE  NEGATIVE   Ketones, ur NEGATIVE  NEGATIVE mg/dL   Protein, ur NEGATIVE  NEGATIVE mg/dL   Urobilinogen, UA 0.2  0.0  - 1.0 mg/dL   Nitrite NEGATIVE  NEGATIVE   Leukocytes, UA NEGATIVE  NEGATIVE   Comment: MICROSCOPIC NOT DONE ON URINES WITH NEGATIVE PROTEIN, BLOOD, LEUKOCYTES, NITRITE, OR GLUCOSE <1000 mg/dL.  GLUCOSE, CAPILLARY     Status: None   Collection Time    07/05/13  7:05 PM      Result Value Range   Glucose-Capillary 87  70 - 99 mg/dL  POCT PREGNANCY, URINE     Status: None   Collection Time    07/05/13  7:19 PM      Result Value Range   Preg Test, Ur NEGATIVE  NEGATIVE   Comment:            THE SENSITIVITY OF THIS     METHODOLOGY IS >24 mIU/mL  GLUCOSE, CAPILLARY     Status: Abnormal   Collection Time    07/06/13 12:58 AM      Result Value Range   Glucose-Capillary 115 (*) 70 - 99 mg/dL      MDM  Patient with tension headache, uncontrolled HTN and DM. Patient pain free on re-exam. NO focal neuro deficits. SW has briefly consulted and provided the patient with contact information for local mental health resources. I have written prescriptions for patient to have the medications which she was most recently prescribed for DM, HTN and depression. Med list obtained from patient and [facility].  Patient advised that all meds are on $4 list at [facility].   Patient says she is feeling much better and is ready to go home.   Brandt Loosen, MD 07/07/13 [phone]

## 2013-07-06 NOTE — ED Notes (Signed)
Pt reports having h/a starting yesterday morning. Pt has hx of h/a's but states this one has kept her from doing her ADL's. Pt reports feeling nauseous today with 5 episodes of vomiting. Pt reports feeling sluggish and reports having "shakes" all day. Pt has hx of DM and has not been taking oral meds due to issues with insurance.

## 2014-01-26 ENCOUNTER — Encounter (HOSPITAL_COMMUNITY): Payer: Self-pay | Admitting: Emergency Medicine

## 2014-01-26 ENCOUNTER — Emergency Department (HOSPITAL_COMMUNITY): Payer: Self-pay

## 2014-01-26 ENCOUNTER — Emergency Department (HOSPITAL_COMMUNITY)
Admission: EM | Admit: 2014-01-26 | Discharge: 2014-01-26 | Disposition: A | Discharge status: Home / Self Care | Payer: Self-pay | Attending: Emergency Medicine | Admitting: Emergency Medicine

## 2014-01-26 DIAGNOSIS — F17200 Nicotine dependence, unspecified, uncomplicated: Secondary | ICD-10-CM | POA: Insufficient documentation

## 2014-01-26 DIAGNOSIS — R079 Chest pain, unspecified: Secondary | ICD-10-CM | POA: Insufficient documentation

## 2014-01-26 DIAGNOSIS — I10 Essential (primary) hypertension: Secondary | ICD-10-CM | POA: Insufficient documentation

## 2014-01-26 DIAGNOSIS — R519 Headache, unspecified: Secondary | ICD-10-CM

## 2014-01-26 DIAGNOSIS — H538 Other visual disturbances: Secondary | ICD-10-CM | POA: Insufficient documentation

## 2014-01-26 DIAGNOSIS — R55 Syncope and collapse: Secondary | ICD-10-CM | POA: Insufficient documentation

## 2014-01-26 DIAGNOSIS — R51 Headache: Secondary | ICD-10-CM | POA: Insufficient documentation

## 2014-01-26 DIAGNOSIS — R110 Nausea: Secondary | ICD-10-CM | POA: Insufficient documentation

## 2014-01-26 DIAGNOSIS — Z79899 Other long term (current) drug therapy: Secondary | ICD-10-CM | POA: Insufficient documentation

## 2014-01-26 DIAGNOSIS — E119 Type 2 diabetes mellitus without complications: Secondary | ICD-10-CM | POA: Insufficient documentation

## 2014-01-26 LAB — CBC WITH DIFFERENTIAL/PLATELET
BASOS PCT: 0 % (ref 0–1)
Basophils Absolute: 0 10*3/uL (ref 0.0–0.1)
EOS ABS: 0.1 10*3/uL (ref 0.0–0.7)
Eosinophils Relative: 1 % (ref 0–5)
HCT: 29.3 % — ABNORMAL LOW (ref 36.0–46.0)
HEMOGLOBIN: 8.8 g/dL — AB (ref 12.0–15.0)
Lymphocytes Relative: 33 % (ref 12–46)
Lymphs Abs: 2.1 10*3/uL (ref 0.7–4.0)
MCH: 18.9 pg — AB (ref 26.0–34.0)
MCHC: 30 g/dL (ref 30.0–36.0)
MCV: 63 fL — ABNORMAL LOW (ref 78.0–100.0)
MONO ABS: 0.5 10*3/uL (ref 0.1–1.0)
MONOS PCT: 8 % (ref 3–12)
NEUTROS PCT: 58 % (ref 43–77)
Neutro Abs: 3.7 10*3/uL (ref 1.7–7.7)
Platelets: 262 10*3/uL (ref 150–400)
RBC: 4.65 MIL/uL (ref 3.87–5.11)
RDW: 18.6 % — ABNORMAL HIGH (ref 11.5–15.5)
WBC: 6.3 10*3/uL (ref 4.0–10.5)

## 2014-01-26 LAB — COMPREHENSIVE METABOLIC PANEL
ALBUMIN: 4 g/dL (ref 3.5–5.2)
ALT: 18 U/L (ref 0–35)
AST: 18 U/L (ref 0–37)
Alkaline Phosphatase: 85 U/L (ref 39–117)
BUN: 12 mg/dL (ref 6–23)
CO2: 24 mEq/L (ref 19–32)
CREATININE: 0.69 mg/dL (ref 0.50–1.10)
Calcium: 8.9 mg/dL (ref 8.4–10.5)
Chloride: 105 mEq/L (ref 96–112)
GFR calc Af Amer: >90 mL/min (ref >90)
GFR calc non Af Amer: >90 mL/min (ref >90)
Glucose, Bld: 86 mg/dL (ref 70–99)
POTASSIUM: 3.8 meq/L (ref 3.7–5.3)
Sodium: 142 mEq/L (ref 137–147)
TOTAL PROTEIN: 8.1 g/dL (ref 6.0–8.3)
Total Bilirubin: 0.2 mg/dL — ABNORMAL LOW (ref 0.3–1.2)

## 2014-01-26 LAB — GLUCOSE, CAPILLARY: GLUCOSE-CAPILLARY: 124 mg/dL — AB (ref 70–99)

## 2014-01-26 LAB — POCT I-STAT TROPONIN I: Troponin i, poc: 0 ng/mL (ref 0.00–0.08)

## 2014-01-26 MED ORDER — METOCLOPRAMIDE HCL 5 MG/ML IJ SOLN
10.0000 mg | Freq: Once | INTRAMUSCULAR | Status: AC
  Administered 2014-01-26: 10 mg via INTRAVENOUS
  Filled 2014-01-26: qty 2

## 2014-01-26 MED ORDER — DIPHENHYDRAMINE HCL 50 MG/ML IJ SOLN
25.0000 mg | Freq: Once | INTRAMUSCULAR | Status: AC
  Administered 2014-01-26: 25 mg via INTRAVENOUS
  Filled 2014-01-26: qty 1

## 2014-01-26 MED ORDER — SODIUM CHLORIDE 0.9 % IV BOLUS (SEPSIS)
1000.0000 mL | Freq: Once | INTRAVENOUS | Status: AC
  Administered 2014-01-26: 1000 mL via INTRAVENOUS

## 2014-01-26 MED ORDER — DEXAMETHASONE 6 MG PO TABS: ORAL_TABLET | ORAL | Status: DC

## 2014-01-26 MED ORDER — DEXAMETHASONE SODIUM PHOSPHATE 10 MG/ML IJ SOLN
10.0000 mg | Freq: Once | INTRAMUSCULAR | Status: AC
  Administered 2014-01-26: 10 mg via INTRAVENOUS
  Filled 2014-01-26: qty 1

## 2014-01-26 MED ORDER — HYDROCODONE-ACETAMINOPHEN 5-325 MG PO TABS: 1.0000 | ORAL_TABLET | ORAL | Status: DC | PRN

## 2014-01-26 NOTE — ED Provider Notes (Signed)
Patient reassessed. No gross neurologic deficits appreciated. Speech is clear. Patient is ambulatory without problem. Patient remains headache free after IV medications.  Labs reviewed in no acute changes appreciated. CT head scan shows no acute intracranial findings. There was noted some calcification throughout the basal ganglia, question of hyperparathyroidism was raised.  Patient be discharged home. I've encouraged patient to have close follow up with primary physician. Prescription for Decadron and Norco given in the event of recurring headache. Patient advised to return to the emergency department if any acute changes or problems. Patient acknowledges understanding of the instructions.  Kathie DikeHobson M Berdina Cheever, PA-C 01/26/14 2118

## 2014-01-26 NOTE — Discharge Instructions (Signed)
Your labs and CT scans are negative for acute event. Your EKG is negative for acute event. Please see the headache specialist listed above for additional evaluation and management of your headaches. Please use Tylenol for mild pain. Use Norco for more severe pain. This medication may cause drowsiness, please use with caution. Please use Decadron daily with food until all taken. Near-Syncope Near-syncope (commonly known as near fainting) is sudden weakness, dizziness, or feeling like you might pass out. During an episode of near-syncope, you may also develop pale skin, have tunnel vision, or feel sick to your stomach (nauseous). Near-syncope may occur when getting up after sitting or while standing for a long time. It is caused by a sudden decrease in blood flow to the brain. This decrease can result from various causes or triggers, most of which are not serious. However, because near-syncope can sometimes be a sign of something serious, a medical evaluation is required. The specific cause is often not determined. HOME CARE INSTRUCTIONS  Monitor your condition for any changes. The following actions may help to alleviate any discomfort you are experiencing:  Have someone stay with you until you feel stable.  Lie down right away if you start feeling like you might faint. Breathe deeply and steadily. Wait until all the symptoms have passed. Most of these episodes last only a few minutes. You may feel tired for several hours.   Drink enough fluids to keep your urine clear or pale yellow.   If you are taking blood pressure or heart medicine, get up slowly when seated or lying down. Take several minutes to sit and then stand. This can reduce dizziness.  Follow up with your health care provider as directed. SEEK IMMEDIATE MEDICAL CARE IF:   You have a severe headache.   You have unusual pain in the chest, abdomen, or back.   You are bleeding from the mouth or rectum, or you have black or tarry  stool.   You have an irregular or very fast heartbeat.   You have repeated fainting or have seizure-like jerking during an episode.   You faint when sitting or lying down.   You have confusion.   You have difficulty walking.   You have severe weakness.   You have vision problems.  MAKE SURE YOU:   Understand these instructions.  Will watch your condition.  Will get help right away if you are not doing well or get worse. Document Released: 12/05/2005 Document Revised: 08/07/2013 Document Reviewed: 05/10/2013 [facility] Patient Information 2014 [employer], [place].  Headaches, Frequently Asked Questions MIGRAINE HEADACHES Q: What is migraine? What causes it? How can I treat it? A: Generally, migraine headaches begin as a dull ache. Then they develop into a constant, throbbing, and pulsating pain. You may experience pain at the temples. You may experience pain at the front or back of one or both sides of the head. The pain is usually accompanied by a combination of:  Nausea.  Vomiting.  Sensitivity to light and noise. Some people (about 15%) experience an aura (see below) before an attack. The cause of migraine is believed to be chemical reactions in the brain. Treatment for migraine may include over-the-counter or prescription medications. It may also include self-help techniques. These include relaxation training and biofeedback.  Q: What is an aura? A: About 15% of people with migraine get an "aura". This is a sign of neurological symptoms that occur before a migraine headache. You may see wavy or jagged lines, dots, or flashing  lights. You might experience tunnel vision or blind spots in one or both eyes. The aura can include visual or auditory hallucinations (something imagined). It may include disruptions in smell (such as strange odors), taste or touch. Other symptoms include:  Numbness.  A "pins and needles" sensation.  Difficulty in recalling or speaking the correct  word. These neurological events may last as long as 60 minutes. These symptoms will fade as the headache begins. Q: What is a trigger? A: Certain physical or environmental factors can lead to or "trigger" a migraine. These include:  Foods.  Hormonal changes.  Weather.  Stress. It is important to remember that triggers are different for everyone. To help prevent migraine attacks, you need to figure out which triggers affect you. Keep a headache diary. This is a good way to track triggers. The diary will help you talk to your healthcare professional about your condition. Q: Does weather affect migraines? A: Bright sunshine, hot, humid conditions, and drastic changes in barometric pressure may lead to, or "trigger," a migraine attack in some people. But studies have shown that weather does not act as a trigger for everyone with migraines. Q: What is the link between migraine and hormones? A: Hormones start and regulate many of your body's functions. Hormones keep your body in balance within a constantly changing environment. The levels of hormones in your body are unbalanced at times. Examples are during menstruation, pregnancy, or menopause. That can lead to a migraine attack. In fact, about three quarters of all women with migraine report that their attacks are related to the menstrual cycle.  Q: Is there an increased risk of stroke for migraine sufferers? A: The likelihood of a migraine attack causing a stroke is very remote. That is not to say that migraine sufferers cannot have a stroke associated with their migraines. In persons under age 108, the most common associated factor for stroke is migraine headache. But over the course of a person's normal life span, the occurrence of migraine headache may actually be associated with a reduced risk of dying from cerebrovascular disease due to stroke.  Q: What are acute medications for migraine? A: Acute medications are used to treat the pain of the  headache after it has started. Examples over-the-counter medications, NSAIDs, ergots, and triptans.  Q: What are the triptans? A: Triptans are the newest class of abortive medications. They are specifically targeted to treat migraine. Triptans are vasoconstrictors. They moderate some chemical reactions in the brain. The triptans work on receptors in your brain. Triptans help to restore the balance of a neurotransmitter called serotonin. Fluctuations in levels of serotonin are thought to be a main cause of migraine.  Q: Are over-the-counter medications for migraine effective? A: Over-the-counter, or "OTC," medications may be effective in relieving mild to moderate pain and associated symptoms of migraine. But you should see your caregiver before beginning any treatment regimen for migraine.  Q: What are preventive medications for migraine? A: Preventive medications for migraine are sometimes referred to as "prophylactic" treatments. They are used to reduce the frequency, severity, and length of migraine attacks. Examples of preventive medications include antiepileptic medications, antidepressants, beta-blockers, calcium channel blockers, and NSAIDs (nonsteroidal anti-inflammatory drugs). Q: Why are anticonvulsants used to treat migraine? A: During the past few years, there has been an increased interest in antiepileptic drugs for the prevention of migraine. They are sometimes referred to as "anticonvulsants". Both epilepsy and migraine may be caused by similar reactions in the brain.  Q: Why  are antidepressants used to treat migraine? A: Antidepressants are typically used to treat people with depression. They may reduce migraine frequency by regulating chemical levels, such as serotonin, in the brain.  Q: What alternative therapies are used to treat migraine? A: The term "alternative therapies" is often used to describe treatments considered outside the scope of conventional Western medicine. Examples of  alternative therapy include acupuncture, acupressure, and yoga. Another common alternative treatment is herbal therapy. Some herbs are believed to relieve headache pain. Always discuss alternative therapies with your caregiver before proceeding. Some herbal products contain arsenic and other toxins. TENSION HEADACHES Q: What is a tension-type headache? What causes it? How can I treat it? A: Tension-type headaches occur randomly. They are often the result of temporary stress, anxiety, fatigue, or anger. Symptoms include soreness in your temples, a tightening band-like sensation around your head (a "vice-like" ache). Symptoms can also include a pulling feeling, pressure sensations, and contracting head and neck muscles. The headache begins in your forehead, temples, or the back of your head and neck. Treatment for tension-type headache may include over-the-counter or prescription medications. Treatment may also include self-help techniques such as relaxation training and biofeedback. CLUSTER HEADACHES Q: What is a cluster headache? What causes it? How can I treat it? A: Cluster headache gets its name because the attacks come in groups. The pain arrives with little, if any, warning. It is usually on one side of the head. A tearing or bloodshot eye and a runny nose on the same side of the headache may also accompany the pain. Cluster headaches are believed to be caused by chemical reactions in the brain. They have been described as the most severe and intense of any headache type. Treatment for cluster headache includes prescription medication and oxygen. SINUS HEADACHES Q: What is a sinus headache? What causes it? How can I treat it? A: When a cavity in the bones of the face and skull (a sinus) becomes inflamed, the inflammation will cause localized pain. This condition is usually the result of an allergic reaction, a tumor, or an infection. If your headache is caused by a sinus blockage, such as an infection,  you will probably have a fever. An x-ray will confirm a sinus blockage. Your caregiver's treatment might include antibiotics for the infection, as well as antihistamines or decongestants.  REBOUND HEADACHES Q: What is a rebound headache? What causes it? How can I treat it? A: A pattern of taking acute headache medications too often can lead to a condition known as "rebound headache." A pattern of taking too much headache medication includes taking it more than 2 days per week or in excessive amounts. That means more than the label or a caregiver advises. With rebound headaches, your medications not only stop relieving pain, they actually begin to cause headaches. Doctors treat rebound headache by tapering the medication that is being overused. Sometimes your caregiver will gradually substitute a different type of treatment or medication. Stopping may be a challenge. Regularly overusing a medication increases the potential for serious side effects. Consult a caregiver if you regularly use headache medications more than 2 days per week or more than the label advises. ADDITIONAL QUESTIONS AND ANSWERS Q: What is biofeedback? A: Biofeedback is a self-help treatment. Biofeedback uses special equipment to monitor your body's involuntary physical responses. Biofeedback monitors:  Breathing.  Pulse.  Heart rate.  Temperature.  Muscle tension.  Brain activity. Biofeedback helps you refine and perfect your relaxation exercises. You learn to control  the physical responses that are related to stress. Once the technique has been mastered, you do not need the equipment any more. Q: Are headaches hereditary? A: Four out of five (80%) of people that suffer report a family history of migraine. Scientists are not sure if this is genetic or a family predisposition. Despite the uncertainty, a child has a 50% chance of having migraine if one parent suffers. The child has a 75% chance if both parents suffer.  Q: Can  children get headaches? A: By the time they reach high school, most young people have experienced some type of headache. Many safe and effective approaches or medications can prevent a headache from occurring or stop it after it has begun.  Q: What type of doctor should I see to diagnose and treat my headache? A: Start with your primary caregiver. Discuss his or her experience and approach to headaches. Discuss methods of classification, diagnosis, and treatment. Your caregiver may decide to recommend you to a headache specialist, depending upon your symptoms or other physical conditions. Having diabetes, allergies, etc., may require a more comprehensive and inclusive approach to your headache. The National Headache Foundation will provide, upon request, a list of [facility] physician members in your state. Document Released: 02/25/2004 Document Revised: 02/27/2012 Document Reviewed: 08/04/2008 [facility] Patient Information 2014 [place], [place].

## 2014-01-26 NOTE — ED Notes (Signed)
CBG 124 

## 2014-01-26 NOTE — ED Provider Notes (Signed)
CSN: [id number]     Arrival date & time 01/26/14  1829 History   First MD Initiated Contact with Patient 01/26/14 1853     Chief Complaint  Patient presents with  . Chest Pain  . Near Syncope   (Consider location/radiation/quality/duration/timing/severity/associated sxs/prior Treatment) Patient is a 38 y.o. female presenting with chest pain, near-syncope, and headaches. The history is provided by the patient.  Chest Pain Associated symptoms: headache, nausea and near-syncope   Associated symptoms: no abdominal pain, no cough, no dizziness, no fatigue, no fever, no numbness, no shortness of breath and not vomiting   Near Syncope Associated symptoms include chest pain (mild, brief, none at this time) and headaches. Pertinent negatives include no abdominal pain and no shortness of breath.  Headache Pain location:  Generalized Quality:  Sharp Radiates to:  Does not radiate Onset quality:  Sudden Timing:  Constant Progression:  Improving Chronicity:  Chronic Similar to prior headaches: no   Context: not exposure to bright light and not eating   Context comment:  Was at work, no noxious stimuli Relieved by:  Nothing Worsened by:  Light Ineffective treatments:  None tried Associated symptoms: blurred vision, nausea, near-syncope and visual change   Associated symptoms: no abdominal pain, no cough, no dizziness, no pain, no facial pain, no fatigue, no fever, no loss of balance, no neck stiffness, no numbness, no paresthesias, no seizures, no vomiting and no weakness     Past Medical History  Diagnosis Date  . Hypertension   . Diabetes mellitus without complication    Past Surgical History  Procedure Laterality Date  . Btl    . Leep     No family history on file. History  Substance Use Topics  . Smoking status: Current Some Day Smoker    Types: Cigarettes  . Smokeless tobacco: Not on file  . Alcohol Use: No   OB History   Grav Para Term Preterm Abortions TAB SAB Ect Mult  Living                 Review of Systems  Constitutional: Negative for fever and fatigue.  Eyes: Positive for blurred vision. Negative for pain.  Respiratory: Negative for cough and shortness of breath.   Cardiovascular: Positive for chest pain (mild, brief, none at this time) and near-syncope.  Gastrointestinal: Positive for nausea. Negative for vomiting and abdominal pain.  Musculoskeletal: Negative for neck stiffness.  Neurological: Positive for headaches. Negative for dizziness, seizures, numbness, paresthesias and loss of balance.  All other systems reviewed and are negative.    Allergies  Review of patient's allergies indicates no known allergies.  Home Medications   Current Outpatient Rx  Name  Route  Sig  Dispense  Refill  . amLODipine (NORVASC) 10 MG tablet   Oral   Take 1 tablet (10 mg total) by mouth daily.   30 tablet   0   . metFORMIN (GLUCOPHAGE) 1000 MG tablet   Oral   Take 1 tablet (1,000 mg total) by mouth 2 (two) times daily.   30 tablet   0   . oxyCODONE-acetaminophen (PERCOCET/ROXICET) 5-325 MG per tablet   Oral   Take 2 tablets by mouth every 4 (four) hours as needed for pain.   6 tablet   0   . PARoxetine (PAXIL) 20 MG tablet   Oral   Take 0.5 tablets (10 mg total) by mouth every morning.   30 tablet   0    BP 149/85  Pulse 74  Temp(Src) 98.1 F (36.7 C) (Oral)  Resp 18  Ht 4\' 11"  (1.499 m)  Wt 150 lb (68.04 kg)  BMI 30.28 kg/m2  SpO2 100% Physical Exam  Nursing note and vitals reviewed. Constitutional: She is oriented to person, place, and time. She appears well-developed and well-nourished. No distress.  HENT:  Head: Normocephalic and atraumatic.  Eyes: EOM are normal. Pupils are equal, round, and reactive to light.  Neck: Normal range of motion. Neck supple.  Cardiovascular: Normal rate and regular rhythm.  Exam reveals no friction rub.   No murmur heard. Pulmonary/Chest: Effort normal and breath sounds normal. No respiratory  distress. She has no wheezes. She has no rales.  Abdominal: Soft. She exhibits no distension. There is no tenderness. There is no rebound.  Musculoskeletal: Normal range of motion. She exhibits no edema.  Neurological: She is alert and oriented to person, place, and time. No cranial nerve deficit. She exhibits normal muscle tone. Coordination normal.  Skin: No rash noted. She is not diaphoretic.    ED Course  Procedures (including critical care time) Labs Review Labs Reviewed  GLUCOSE, CAPILLARY - Abnormal; Notable for the following:    Glucose-Capillary 124 (*)    All other components within normal limits  CBC WITH DIFFERENTIAL  COMPREHENSIVE METABOLIC PANEL   Imaging Review No results found.  EKG Interpretation    Date/Time:  Sunday January 26 2014 18:38:59 EST Ventricular Rate:  80 PR Interval:  140 QRS Duration: 84 QT Interval:  382 QTC Calculation: 440 R Axis:   56 Text Interpretation:  Normal sinus rhythm Normal ECG No prior Confirmed by Gwendolyn Grant  MD, Laquita Harlan ([id number]) on 01/26/2014 6:55:28 PM            MDM   1. Headache   2. Near syncope    29F here with headache. Headache began suddenly at work, had spots in her vision, blurry vision, and some near-syncope. Mild nausea. No vomiting, no true syncope. Feeling better now, but still not feeling like herself. No noxious stimuli. Mild CP at that time, none now. Patient with hx of HTN, DM, otherwise normal. Friend here with her is an aneurysm survivior. History not c/w aneurysm, no family hx of aneurysm, appears well here. She is photophobic. I explained to her that CT doesn't fully rule out bleed, she is comfortable with CT only. I do not feel her history is c/w true aneurysm, but she is very concerned, so I will proceed with a CT. She has chronic headaches, this is different from her chronic headaches. I will check basic labs. Headache cocktail given - hx of chronic headaches. Patient given fluids.   Care to [facility]  while awaiting CT scan.   Dagmar Hait, MD 01/26/14 2023

## 2014-01-26 NOTE — ED Notes (Addendum)
Pt presents to department for evaluation of near syncope. States she was at work when she became dizzy and like she was going to pass out. Also states blurred vision, chest pain and fatigue. Upon arrival states 5/10 headache. Also states chest tightness and trouble breathing. Respirations unlabored. She is alert and oriented x4. Able to move all extremities. No neurological deficits noted.

## 2014-01-28 NOTE — ED Provider Notes (Signed)
Medical screening examination/treatment/procedure(s) were conducted as a shared visit with non-physician practitioner(s) and myself.  I personally evaluated the patient during the encounter.  EKG Interpretation    Date/Time:  Sunday January 26 2014 18:38:59 EST Ventricular Rate:  80 PR Interval:  140 QRS Duration: 84 QT Interval:  382 QTC Calculation: 440 R Axis:   56 Text Interpretation:  Normal sinus rhythm Normal ECG No prior Confirmed by Gwendolyn GrantWALDEN  MD, Anes Rigel ([id number]) on 01/26/2014 6:55:28 PM            Patient seen by me primarily, [facility] to [employer]. See my note for full details.  Dagmar HaitWilliam Croy Drumwright, MD 01/28/14 [phone]

## 2014-07-17 ENCOUNTER — Encounter (HOSPITAL_COMMUNITY): Payer: Self-pay | Admitting: Emergency Medicine

## 2014-07-17 ENCOUNTER — Emergency Department (HOSPITAL_COMMUNITY)
Admission: EM | Admit: 2014-07-17 | Discharge: 2014-07-17 | Disposition: A | Discharge status: Home / Self Care | Payer: Self-pay | Attending: Emergency Medicine | Admitting: Emergency Medicine

## 2014-07-17 DIAGNOSIS — Z7982 Long term (current) use of aspirin: Secondary | ICD-10-CM | POA: Insufficient documentation

## 2014-07-17 DIAGNOSIS — E119 Type 2 diabetes mellitus without complications: Secondary | ICD-10-CM | POA: Insufficient documentation

## 2014-07-17 DIAGNOSIS — H9209 Otalgia, unspecified ear: Secondary | ICD-10-CM | POA: Insufficient documentation

## 2014-07-17 DIAGNOSIS — I10 Essential (primary) hypertension: Secondary | ICD-10-CM | POA: Insufficient documentation

## 2014-07-17 DIAGNOSIS — Z87891 Personal history of nicotine dependence: Secondary | ICD-10-CM | POA: Insufficient documentation

## 2014-07-17 DIAGNOSIS — Z79899 Other long term (current) drug therapy: Secondary | ICD-10-CM | POA: Insufficient documentation

## 2014-07-17 DIAGNOSIS — J020 Streptococcal pharyngitis: Secondary | ICD-10-CM | POA: Insufficient documentation

## 2014-07-17 DIAGNOSIS — J029 Acute pharyngitis, unspecified: Secondary | ICD-10-CM | POA: Insufficient documentation

## 2014-07-17 LAB — RAPID STREP SCREEN (MED CTR MEBANE ONLY): STREPTOCOCCUS, GROUP A SCREEN (DIRECT): POSITIVE — AB

## 2014-07-17 MED ORDER — PREDNISONE 20 MG PO TABS: ORAL_TABLET | ORAL | Status: DC

## 2014-07-17 MED ORDER — OXYCODONE-ACETAMINOPHEN 5-325 MG PO TABS: 2.0000 | ORAL_TABLET | ORAL | Status: DC | PRN

## 2014-07-17 MED ORDER — PENICILLIN G BENZATHINE 1200000 UNIT/2ML IM SUSP
1.2000 10*6.[IU] | Freq: Once | INTRAMUSCULAR | Status: AC
  Administered 2014-07-17: 1.2 10*6.[IU] via INTRAMUSCULAR
  Filled 2014-07-17: qty 2

## 2014-07-17 MED ORDER — OXYCODONE-ACETAMINOPHEN 5-325 MG PO TABS
1.0000 | ORAL_TABLET | Freq: Once | ORAL | Status: AC
  Administered 2014-07-17: 1 via ORAL
  Filled 2014-07-17: qty 1

## 2014-07-17 MED ORDER — MAGIC MOUTHWASH
5.0000 mL | Freq: Once | ORAL | Status: AC
  Administered 2014-07-17: 5 mL via ORAL
  Filled 2014-07-17: qty 5

## 2014-07-17 NOTE — ED Notes (Signed)
Patients right side of her throat is red and swelling noted below her right ear

## 2014-07-17 NOTE — ED Notes (Signed)
Patient presents stating she has right ear pain and her right side of her throat is painful  States she has trouble eating due to the pain

## 2014-07-17 NOTE — Discharge Instructions (Signed)

## 2014-07-17 NOTE — ED Provider Notes (Signed)
CSN: [id number]     Arrival date & time 07/17/14  1920 History  This chart was scribed for Fayrene Helper, PA-C working with Rolland Porter, MD by Evon Slack, ED Scribe. This patient was seen in room TR07C/TR07C and the patient's care was started at 8:15 PM.    Chief Complaint  Patient presents with  . Sore Throat  . Otalgia   Patient is a 38 y.o. female presenting with pharyngitis and ear pain. The history is provided by the patient. No language interpreter was used.  Sore Throat  Otalgia Associated symptoms: cough, rhinorrhea and sore throat   Associated symptoms: no fever    HPI Comments: [name] is a 38 y.o. female who presents to the Emergency Department complaining of right sided sore throat onset 3 days prior. Pt has associated right ear pain, rhinorrhea, and cough. States that she has taken OTC medication with no relief. She states that it is also painful to swallow. She states that some of her co workers have had similar symptoms. Pt denies fever, or chills. Able to swallow her spit. No abd pain.     Past Medical History  Diagnosis Date  . Hypertension   . Diabetes mellitus without complication    Past Surgical History  Procedure Laterality Date  . Btl    . Leep     History reviewed. No pertinent family history. History  Substance Use Topics  . Smoking status: Former Smoker    Types: Cigarettes  . Smokeless tobacco: Never Used  . Alcohol Use: No   OB History   Grav Para Term Preterm Abortions TAB SAB Ect Mult Living                 Review of Systems  Constitutional: Negative for fever and chills.  HENT: Positive for ear pain, rhinorrhea and sore throat.   Respiratory: Positive for cough.     Allergies  Review of patient's allergies indicates no known allergies.  Home Medications   Prior to Admission medications   Medication Sig Start Date End Date Taking? Authorizing Provider  acetaminophen (TYLENOL) 500 MG tablet Take 500 mg by mouth every 6  (six) hours as needed for moderate pain.    Historical Provider, MD  Aspirin-Acetaminophen-Caffeine (GOODY HEADACHE PO) Take 1 Package by mouth daily as needed (for pain).    Historical Provider, MD  dexamethasone (DECADRON) 6 MG tablet 1 po bid with food 01/26/14   Kathie Dike, PA-C  DM-Doxylamine-Acetaminophen 15-6.25-325 MG/15ML LIQD Take 30 mLs by mouth at bedtime as needed.    Historical Provider, MD  HYDROcodone-acetaminophen (NORCO/VICODIN) 5-325 MG per tablet Take 1 tablet by mouth every 4 (four) hours as needed. 01/26/14   Kathie Dike, PA-C   Triage Vitals: BP 157/98  Pulse 85  Temp(Src) 99 F (37.2 C) (Oral)  Resp 20  Ht 4\' 11"  (1.499 m)  Wt 175 lb (79.379 kg)  BMI 35.33 kg/m2  SpO2 100%  LMP 06/28/2014  Physical Exam  Nursing note and vitals reviewed. Constitutional: She is oriented to person, place, and time. She appears well-developed and well-nourished. No distress.  HENT:  Head: Normocephalic and atraumatic.  Right Ear: Tympanic membrane normal.  Left Ear: Tympanic membrane normal.  Mouth/Throat: Uvula is midline. No trismus in the jaw. No tonsillar abscesses.   no tonsillar enlargement, tonsillar exudate bilaterally, pt is able to swallow saliva.   Eyes: Conjunctivae and EOM are normal.  Neck: Neck supple.  Cardiovascular: Normal rate.   Pulmonary/Chest:  Effort normal. No respiratory distress.  Musculoskeletal: Normal range of motion.  Lymphadenopathy:    She has cervical adenopathy.  Neurological: She is alert and oriented to person, place, and time.  Skin: Skin is warm and dry.  Psychiatric: She has a normal mood and affect. Her behavior is normal.    ED Course  Procedures (including critical care time) DIAGNOSTIC STUDIES: Oxygen Saturation is 100% on RA, normal by my interpretation.    COORDINATION OF CARE:  Pt with R sided throat pain, strep positive. No obvious signs of PTA or RPA.  Tolerates own saliva.  Stable for discharge with ENT f/u as  needed, return precaution discussed.    Labs Review Labs Reviewed  RAPID STREP SCREEN - Abnormal; Notable for the following:    Streptococcus, Group A Screen (Direct) POSITIVE (*)    All other components within normal limits    Imaging Review No results found.   EKG Interpretation None      MDM   Final diagnoses:  Streptococcal pharyngitis   BP 162/92  Pulse 76  Temp(Src) 98.7 F (37.1 C) (Oral)  Resp 18  Ht 4\' 11"  (1.499 m)  Wt 175 lb (79.379 kg)  BMI 35.33 kg/m2  SpO2 100%  LMP 06/28/2014   I personally performed the services described in this documentation, which was scribed in my presence. The recorded information has been reviewed and is accurate.       Fayrene HelperBowie Sherrod Toothman, PA-C 07/17/14 [id number]

## 2014-07-24 ENCOUNTER — Encounter (HOSPITAL_COMMUNITY): Payer: Self-pay | Admitting: Emergency Medicine

## 2014-07-24 ENCOUNTER — Emergency Department (HOSPITAL_COMMUNITY): Payer: Self-pay

## 2014-07-24 ENCOUNTER — Emergency Department (HOSPITAL_COMMUNITY)
Admission: EM | Admit: 2014-07-24 | Discharge: 2014-07-24 | Disposition: A | Discharge status: Home / Self Care | Payer: Self-pay | Attending: Emergency Medicine | Admitting: Emergency Medicine

## 2014-07-24 DIAGNOSIS — N925 Other specified irregular menstruation: Secondary | ICD-10-CM | POA: Insufficient documentation

## 2014-07-24 DIAGNOSIS — E119 Type 2 diabetes mellitus without complications: Secondary | ICD-10-CM | POA: Insufficient documentation

## 2014-07-24 DIAGNOSIS — Z3202 Encounter for pregnancy test, result negative: Secondary | ICD-10-CM | POA: Insufficient documentation

## 2014-07-24 DIAGNOSIS — N938 Other specified abnormal uterine and vaginal bleeding: Secondary | ICD-10-CM | POA: Insufficient documentation

## 2014-07-24 DIAGNOSIS — N926 Irregular menstruation, unspecified: Secondary | ICD-10-CM

## 2014-07-24 DIAGNOSIS — Z79899 Other long term (current) drug therapy: Secondary | ICD-10-CM | POA: Insufficient documentation

## 2014-07-24 DIAGNOSIS — N946 Dysmenorrhea, unspecified: Secondary | ICD-10-CM | POA: Insufficient documentation

## 2014-07-24 DIAGNOSIS — IMO0002 Reserved for concepts with insufficient information to code with codable children: Secondary | ICD-10-CM | POA: Insufficient documentation

## 2014-07-24 DIAGNOSIS — N949 Unspecified condition associated with female genital organs and menstrual cycle: Secondary | ICD-10-CM | POA: Insufficient documentation

## 2014-07-24 DIAGNOSIS — Z87891 Personal history of nicotine dependence: Secondary | ICD-10-CM | POA: Insufficient documentation

## 2014-07-24 DIAGNOSIS — R109 Unspecified abdominal pain: Secondary | ICD-10-CM | POA: Insufficient documentation

## 2014-07-24 DIAGNOSIS — I10 Essential (primary) hypertension: Secondary | ICD-10-CM | POA: Insufficient documentation

## 2014-07-24 LAB — URINALYSIS, ROUTINE W REFLEX MICROSCOPIC
Bilirubin Urine: NEGATIVE
GLUCOSE, UA: NEGATIVE mg/dL
Hgb urine dipstick: NEGATIVE
Ketones, ur: NEGATIVE mg/dL
LEUKOCYTES UA: NEGATIVE
Nitrite: NEGATIVE
PH: 7 (ref 5.0–8.0)
Protein, ur: 30 mg/dL — AB
Specific Gravity, Urine: 1.017 (ref 1.005–1.030)
Urobilinogen, UA: 1 mg/dL (ref 0.0–1.0)

## 2014-07-24 LAB — I-STAT CHEM 8, ED
BUN: 10 mg/dL (ref 6–23)
CHLORIDE: 107 meq/L (ref 96–112)
Calcium, Ion: 1.06 mmol/L — ABNORMAL LOW (ref 1.12–1.23)
Creatinine, Ser: 0.7 mg/dL (ref 0.50–1.10)
GLUCOSE: 92 mg/dL (ref 70–99)
HEMATOCRIT: 33 % — AB (ref 36.0–46.0)
Hemoglobin: 11.2 g/dL — ABNORMAL LOW (ref 12.0–15.0)
Potassium: 3.5 mEq/L — ABNORMAL LOW (ref 3.7–5.3)
Sodium: 142 mEq/L (ref 137–147)
TCO2: 22 mmol/L (ref 0–100)

## 2014-07-24 LAB — WET PREP, GENITAL
Clue Cells Wet Prep HPF POC: NONE SEEN
Trich, Wet Prep: NONE SEEN
YEAST WET PREP: NONE SEEN

## 2014-07-24 LAB — URINE MICROSCOPIC-ADD ON

## 2014-07-24 LAB — POC URINE PREG, ED: PREG TEST UR: NEGATIVE

## 2014-07-24 LAB — RPR

## 2014-07-24 LAB — HIV ANTIBODY (ROUTINE TESTING W REFLEX): HIV: NONREACTIVE

## 2014-07-24 MED ORDER — ACETAMINOPHEN 325 MG PO TABS
650.0000 mg | ORAL_TABLET | Freq: Once | ORAL | Status: AC
  Administered 2014-07-24: 650 mg via ORAL
  Filled 2014-07-24: qty 2

## 2014-07-24 NOTE — ED Provider Notes (Signed)
CSN: [id number]     Arrival date & time 07/24/14  0848 History   First MD Initiated Contact with Patient 07/24/14 [phone]     Chief Complaint  Patient presents with  . Dysmenorrhea  . Abdominal Pain     (Consider location/radiation/quality/duration/timing/severity/associated sxs/prior Treatment) HPI   Patient to the ER by EMS with  Complaints of heavy menstrual cycle and pain. Per EMS she passed a clot that was the size of a large grape fruit and continues to pass large clots/ Family hx of fibroids. She had her tubes tied  a few years ago and denies that she could be pregnant, although she is sexually active. She otherwise has no other symptoms of weakness, headache, dizziness, nausea, vomiting, diarrhea, fevers, abd pain. She admits that her periods have been different since her tubal ligation.   Past Medical History  Diagnosis Date  . Hypertension   . Diabetes mellitus without complication    Past Surgical History  Procedure Laterality Date  . Btl    . Leep     History reviewed. No pertinent family history. History  Substance Use Topics  . Smoking status: Former Smoker    Types: Cigarettes  . Smokeless tobacco: Never Used  . Alcohol Use: No   OB History   Grav Para Term Preterm Abortions TAB SAB Ect Mult Living                 Review of Systems   Review of Systems  Gen: no weight loss, fevers, chills, night sweats  Eyes: no discharge or drainage, no occular pain or visual changes  Nose: no epistaxis or rhinorrhea  Mouth: no dental pain, no sore throat  Neck: no neck pain  Lungs:No wheezing or hemoptysis No coughing CV:  No palpitations, dependent edema or orthopnea. No chest pain Abd: no diarrhea. No nausea or vomiting, No abdominal pain  GU: no dysuria or gross hematuria  + vaginal bleeding and menstrual pain MSK:  No muscle weakness, No  pain Neuro: no headache, no focal neurologic deficits  Skin: no rash , no wounds Psyche: no complaints    Allergies   Review of patient's allergies indicates no known allergies.  Home Medications   Prior to Admission medications   Medication Sig Start Date End Date Taking? Authorizing Provider  acetaminophen (TYLENOL) 500 MG tablet Take 500 mg by mouth every 6 (six) hours as needed for moderate pain.    Historical Provider, MD  Nutritional Supplements (COLD AND FLU PO) Take 5 mLs by mouth daily as needed. For cold and flu symptoms    Historical Provider, MD  oxyCODONE-acetaminophen (PERCOCET/ROXICET) 5-325 MG per tablet Take 2 tablets by mouth every 4 (four) hours as needed for severe pain. 07/17/14   Fayrene Helper, PA-C  predniSONE (DELTASONE) 20 MG tablet 2 tabs po daily x 4 days 07/17/14   Fayrene Helper, PA-C   BP 152/104  Pulse 83  Resp 16  SpO2 100%  LMP 07/22/2014 Physical Exam  Nursing note and vitals reviewed. Constitutional: She is oriented to person, place, and time. She appears well-developed and well-nourished. No distress.  HENT:  Head: Normocephalic and atraumatic.  Eyes: Pupils are equal, round, and reactive to light.  Neck: Normal range of motion. Neck supple.  Cardiovascular: Normal rate and regular rhythm.   Pulmonary/Chest: Effort normal.  Abdominal: Soft.  Genitourinary: Uterus is tender. Cervix exhibits no motion tenderness, no discharge and no friability. Right adnexum displays no tenderness and no fullness. Left adnexum displays  no tenderness and no fullness. There is tenderness and bleeding around the vagina.  Neurological: She is alert and oriented to person, place, and time. GCS eye subscore is 4. GCS verbal subscore is 5. GCS motor subscore is 6.  Skin: Skin is warm and dry.    ED Course  Procedures (including critical care time) Labs Review Labs Reviewed  GC/CHLAMYDIA PROBE AMP  WET PREP, GENITAL  URINALYSIS, ROUTINE W REFLEX MICROSCOPIC  HIV ANTIBODY (ROUTINE TESTING)  POC URINE PREG, ED    Imaging Review No results found.   EKG Interpretation None      MDM    Final diagnoses:  None    Negative urine pregnancy test Pt is not actively bleeding during pelvic exam and is in no acute distress.  Fast track is now open, I will be handing patient off to Nichole P., PA-C. From this point on she will be managing the patient and following up on labs/imaging.    Dorthula Matasiffany G Shonya Sumida, PA-C 07/24/14 [phone]

## 2014-07-24 NOTE — Discharge Instructions (Signed)
Take acetaminophen (Tylenol) up to 975 mg (this is normally 3 over-the-counter pills) up to 3 times a day. Do not drink alcohol. Make sure your other medications do not contain acetaminophen (Read the labels!)  Do not hesitate to return to the emergency room for any new, worsening or concerning symptoms including soaking through one or more a pattern tampon per hour. Chest pain, heart racing, shortness of breath or feeling lightheaded.  Please obtain primary care using resource guide below. But the minute you were seen in the emergency room and that they will need to obtain records for further outpatient management.   Emergency Department Resource Guide 1) Find a Doctor and Pay Out of Pocket Abnormal Uterine Bleeding Abnormal uterine bleeding means bleeding from the vagina that is not your normal menstrual period. This can be:  Bleeding or spotting between periods.  Bleeding after sex (sexual intercourse).  Bleeding that is heavier or more than normal.  Periods that last longer than usual.  Bleeding after menopause. There are many problems that may cause this. Treatment will depend on the cause of the bleeding. Any kind of bleeding that is not normal should be reviewed by your doctor.  HOME CARE Watch your condition for any changes. These actions may lessen any discomfort you are having:  Do not use tampons or douches as told by your doctor.  Change your pads often. You should get regular pelvic exams and Pap tests. Keep all appointments for tests as told by your doctor. GET HELP IF:  You are bleeding for more than 1 week.  You feel dizzy at times. GET HELP RIGHT AWAY IF:   You pass out.  You have to change pads every 15 to 30 minutes.  You have belly pain.  You have a fever.  You become sweaty or weak.  You are passing large blood clots from the vagina.  You feel sick to your stomach (nauseous) and throw up (vomit). MAKE SURE YOU:  Understand these  instructions.  Will watch your condition.  Will get help right away if you are not doing well or get worse. Document Released: 10/02/2009 Document Revised: 12/10/2013 Document Reviewed: 07/04/2013 [facility] Patient Information 2015 [place], [place]. This information is not intended to replace advice given to you by your health care provider. Make sure you discuss any questions you have with your health care provider.  Although you won't have to find out who is covered by your insurance plan, it is a good idea to ask around and get recommendations. You will then need to call the office and see if the doctor you have chosen will accept you as a new patient and what types of options they offer for patients who are self-pay. Some doctors offer discounts or will set up payment plans for their patients who do not have insurance, but you will need to ask so you aren't surprised when you get to your appointment.  2) Contact Your Local Health Department Not all health departments have doctors that can see patients for sick visits, but many do, so it is worth a call to see if yours does. If you don't know where your local health department is, you can check in your phone book. The CDC also has a tool to help you locate your state's health department, and many state websites also have listings of all of their local health departments.  3) Find a Walk-in Clinic If your illness is not likely to be very severe or complicated, you may  want to try a walk in clinic. These are popping up all over the country in pharmacies, drugstores, and shopping centers. They're usually staffed by nurse practitioners or physician assistants that have been trained to treat common illnesses and complaints. They're usually fairly quick and inexpensive. However, if you have serious medical issues or chronic medical problems, these are probably not your best option.  No Primary Care Doctor: - Call Health Connect at  [phone] - they can  help you locate a primary care doctor that  accepts your insurance, provides certain services, etc. - Physician Referral Service- [phone]  Chronic Pain Problems: Organization         Address  Phone   Notes  [facility]  [phone] Patients need to be referred by their primary care doctor.   Medication Assistance: Organization         Address  Phone   Notes  [facility] Medication [facility] [address] [place], [place] [address] [phone] --Must be a resident of [place] -- Must have NO insurance coverage whatsoever (no Medicaid/ Medicare, etc.) -- The pt. MUST have a primary care doctor that directs their care regularly and follows them in the community   MedAssist  [phone]   [employer]  [phone]    Agencies that provide inexpensive medical care: Organization         Address  Phone   Notes  [facility]  [phone]) [phone]   [facility]    [phone]   [facility] [address] [place], [place] [address] [phone]) [phone]   [facility] [address], [place] [phone]) [phone]   Planned Parenthood    [phone](336) [phone]   [facility]    [phone]([phone]   [facility]  [address], [place] Phone:  [phone]) [phone], Fax:  [phone] Hours of Operation:  9 am - 6 pm, M-F.  Also accepts Medicaid/Medicare and self-pay.  [facility]  [address], [place] Phone: [phone], Fax: [phone]. Hours of Operation:  8:30 am - 5:30 pm, M-F.  Also accepts Medicaid and self-pay.  [facility] [place] [address], [place] Phone: [phone]) [phone]   [facility] [address], [place], [place] [phone], Ext. [phone] Mondays & Thursdays: 7-9 AM.  First 15 patients are seen on a first come, first serve basis.    Medicaid-accepting [facility] Providers:  Organization         Address  Phone   Notes  [facility] [address], [place] [phone] Also accepts self-pay patients.  [facility] [address], [place]  [phone]) [phone]   [facility] [address], [place] [phone]) [phone]   [facility] [address] [place] [address], [place] [phone]) [phone]   Renaye RakersVeita Bland [address], [place]   [phone] Only accepts [place] Access [place] patients after they have their name applied to their card.   Self-Pay (no insurance) in [facility] [place]:  Organization         Address  Phone   Notes  Sickle Cell Patients, [facility] [address], [place] [phone]) [phone]   [facility] [address], [place] [phone]   [facility]  [address], [place] [phone]   [facility]/Dr. Osei-Bonsu  [address], [place] or [address], [place] [phone] Phone number for both [place] and [place] locations is the same.  Urgent Medical and [facility] [address], [place] [phone]   [facility] [address] [place] or [address] [phone]   [facility] [address] [place] [phone], phone; [phone], fax Sees patients 1st and 3rd Saturday of every month.  Must not qualify for public or private insurance (i.e. Medicaid, Medicare, [place] Health Choice, Veterans' Benefits)  Household income should be no more than 200% of the poverty level The clinic cannot treat you if you are pregnant or think you are pregnant  Sexually transmitted diseases are not treated at the clinic.    Dental Care: Organization         Address  Phone  Notes  [facility] Department of [facility]  [facility] [address] [place], [place] [phone] Accepts children up to age 38 who are enrolled in [place] or [place] Health Choice; pregnant women with a Medicaid card; and children who have applied for Medicaid or [place] Health Choice, but were declined, whose parents can pay a reduced fee at time of service.  [facility]  [address], [place] [phone] Accepts children up to age 12 who are enrolled in [place] or [place] Health Choice; pregnant women with a Medicaid card; and children who have applied for Medicaid or [place] Health Choice, but were declined, whose parents can pay a reduced fee at time of service.  [facility]  [address] [place], [place] [phone] Patients are seen by appointment only. Walk-ins are not accepted. [facility] will see patients 55 years of age and older. Monday - Tuesday (8am-5pm) Most Wednesdays (8:30-5pm) $30 per visit, cash only  [facility] Access PRO[facility]  [address], [place] [facility] [phone] Patients are seen by appointment only. Walk-ins are not accepted. [facility] will see patients 75 years of age and older. One Wednesday Evening (Monthly: Volunteer Based).  $30 per visit, cash only  [employer]  [phone] for adults; Children under age 42, call Graduate Pediatric Dentistry at [phone]. Children aged 61-14, please call [phone] to request a pediatric application.  Dental services are provided in all areas of dental care including fillings, crowns and bridges, complete and partial dentures, implants, gum treatment, root canals, and extractions. Preventive care is also provided. Treatment is provided to both adults and children. Patients are selected via a lottery and there is often a waiting list.   [facility] [address], [place]  [phone] [email or web address]   [facility] [address] [place], [place] [phone], Ext. [phone] Second and Fourth Thursday of each month, opens at 6:30 AM; Clinic ends at 9 AM.  Patients are seen on a first-come first-served basis, and a limited number are seen during each clinic.   [facility]  [address] [place] [place], [place] [phone]   Eligibility Requirements You must have lived in [place], [place], or [place] counties for at least the last three months.   You cannot be eligible for state  or Teacher, music, including [employer], [place], or [employer].   You generally cannot be eligible for healthcare insurance through your employer.    How to apply: Eligibility screenings are held every Tuesday and Wednesday afternoon from 1:00 pm until 4:00 pm. You do not need an appointment for the interview!  [facility] [address], [place], [place] [phone]   [facility] Health Department  [phone]   [facility] Health Department  [phone]   [facility] Health Department  [phone]    Behavioral Health Resources in the Community: Intensive Outpatient Programs Organization         Address  Phone  Notes  [facility] [address], [place], [place] [phone]   [facility] Outpatient [address], [place], [place] [phone]   [facility] [address] [place], [place]  [phone]   [facility] [address]  [place], [place] [phone] or [phone]   Substance Abuse Resources Organization         Address  Phone  Notes  Al[facility] Drug Services  [phone]   [facility]  [phone]   [facility] [place]  [phone]   Floydene Flock  [phone]   Residential & Outpatient Substance Abuse Program  [phone]   Psychological Services Organization         Address  Phone  Notes  [facility]  [phone]   [facility] Services  [phone]   [facility] [address], [place] [phone] or [phone]    Mobile Crisis Teams Organization         Address  Phone  Notes  [facility], Mobile Crisis Care Unit  [phone]   [facility]  [address] [place], [place] [phone]   Doristine Locks [address] [place] [phone]    Self-Help/Support Groups Organization         Address  Phone             Notes  [facility] [place] - variety of support groups  336- I7437963 Call for more information  Narcotics Anonymous (NA), [facility] [address], [employer] [place]  2 meetings at this location   Statistician         Address  Phone  Notes  [facility] [address],    [place]  [phone]   [facility]  [address] [place] [address], [place], [place] [phone]   [facility] [address] [place], [place] [phone] Admissions: 8am-3pm M-F  [facility] [address],    [place], [place] [phone]   [facility] [address] [place], [place] [phone]   [facility] [address]  [place], [place] [phone]   [facility] - Intensive Outpatient [address], [place], [place] [phone]   [facility] (Addiction Recovery Care [facility] [address]  [place], [place] [phone] or [phone]   [facility] ([facility]) [address] [place], [place] [phone] Accepts Medicaid  [facility] [address],  [place] [phone] Substance Abuse/Addiction Treatment   [facility] Organization         Address  Phone  Notes  [facility]  [phone]   Angie Fava, PhD [address] [place], [place]   (  [phone]) X3202989 or  [phone]) [phone]   [facility]   [address] [place], [place] [phone]   [facility] [address], [place], [place] [phone] Insurance/Medicaid/sponsorship through [facility] and Families [address]                                    [place], [place] [phone] Therapy/tele-psych/case  [facility] [address]   [place], [place] [phone]    Dr. Lolly Mustache  [phone]   [facility]  [employer] [facility] Dept. 1) [address] [place] 2) [address], [place] 3)  [address], [place] [phone]  [phone]   [facility] Child Abuse Hotline 4701387601 or [phone])[phone] (After Hours)

## 2014-07-24 NOTE — ED Provider Notes (Signed)
Medical screening examination/treatment/procedure(s) were performed by non-physician practitioner and as supervising physician I was immediately available for consultation/collaboration.   EKG Interpretation None        Elwin MochaBlair Iyauna Sing, MD 07/24/14 [phone]

## 2014-07-24 NOTE — ED Provider Notes (Signed)
I personally performed the services described in this documentation, which was scribed in my presence. The recorded information has been reviewed and is accurate.   Violia Knopf, MD 07/24/14 0645 

## 2014-07-24 NOTE — ED Notes (Signed)
Patient transported to Ultrasound 

## 2014-07-24 NOTE — ED Provider Notes (Signed)
PROGRESS NOTE                                                                                                                 This is a sign-out from PA Greene at shift change: [name] is a 38 y.o. female presenting with vaginal bleeding for 3 days, patient passed a large clot this morning and became concerned and presented to the ED. There are no associated symptoms of shortness of breath, chest pain, palpitations, lightheadedness. Plan is to followup bloodwork and ultrasound. Please refer to previous note for full HPI, ROS, PMH and PE.   Patient seen and evaluated the bedside, she is resting comfortably watching TV. Reports significant improvement with acetaminophen for discomfort. Abdominal exam is benign, heart is a regular rate and rhythm with no murmurs rubs or gallops. Lungs are clear to auscultation bilaterally.  Blood work shows no anemia with H&H of 11 point 2/33. Wet prep in urinalysis also unremarkable. Pelvic ultrasound shows 2 uterine fibroids with normal endometrial thickness, no mass there is a complex left ovarian cyst is likely to be a hemorrhagic corpus luteum and follicular cysts.  Filed Vitals:   07/24/14 0900 07/24/14 0930 07/24/14 0931 07/24/14 1044  BP: 148/111 143/79 143/79 137/83  Pulse: 89 72 71 59  Temp:   98.9 F (37.2 C) 98.3 F (36.8 C)  TempSrc:   Oral Oral  Resp:   18 18  SpO2: 100% 100% 100% 100%    Medications  acetaminophen (TYLENOL) tablet 650 mg (650 mg Oral Given 07/24/14 0914)    [name] is a 38 y.o. female presenting with heavy vaginal bleeding and passage of clot this a.m. Urine pregnancy is negative. Serial abdominal exams are benign. Patient encouraged to follow with OB/GYN, discussed return precautions for heavy menstrual bleeding resulting in anemia.  Evaluation does not show pathology that would require ongoing emergent intervention or inpatient treatment. Pt is hemodynamically stable and mentating appropriately. Discussed  findings and plan with patient/guardian, who agrees with care plan. All questions answered. Return precautions discussed and outpatient follow up given.  ,   Wynetta Emeryicole Angelene Rome, PA-C 07/24/14 1154

## 2014-07-24 NOTE — ED Notes (Signed)
Pt from home via [facility] with c/o heavier than normal menstrual cycle x 3 days and lower abdominal pain.  Pt states she has been passing a large number of large blood clots.  Pt in NAD, A&O.

## 2014-07-24 NOTE — ED Provider Notes (Signed)
Medical screening examination/treatment/procedure(s) were performed by non-physician practitioner and as supervising physician I was immediately available for consultation/collaboration.   EKG Interpretation None        Erez Mccallum, MD 07/24/14 1305 

## 2014-07-25 LAB — GC/CHLAMYDIA PROBE AMP
CT Probe RNA: NEGATIVE
GC PROBE AMP APTIMA: NEGATIVE

## 2016-06-08 ENCOUNTER — Encounter: Payer: Self-pay | Admitting: *Deleted

## 2016-06-13 ENCOUNTER — Encounter: Payer: Self-pay | Admitting: Obstetrics & Gynecology

## 2018-04-23 ENCOUNTER — Other Ambulatory Visit: Payer: Self-pay

## 2018-04-23 ENCOUNTER — Emergency Department (HOSPITAL_COMMUNITY): Payer: Medicaid Other

## 2018-04-23 ENCOUNTER — Emergency Department (HOSPITAL_COMMUNITY)
Admission: EM | Admit: 2018-04-23 | Discharge: 2018-04-24 | Disposition: A | Discharge status: Home / Self Care | Payer: Medicaid Other | Attending: Emergency Medicine | Admitting: Emergency Medicine

## 2018-04-23 DIAGNOSIS — Z5321 Procedure and treatment not carried out due to patient leaving prior to being seen by health care provider: Secondary | ICD-10-CM | POA: Diagnosis not present

## 2018-04-23 DIAGNOSIS — R079 Chest pain, unspecified: Secondary | ICD-10-CM

## 2018-04-23 DIAGNOSIS — I10 Essential (primary) hypertension: Secondary | ICD-10-CM

## 2018-04-23 DIAGNOSIS — F17210 Nicotine dependence, cigarettes, uncomplicated: Secondary | ICD-10-CM | POA: Insufficient documentation

## 2018-04-23 DIAGNOSIS — F329 Major depressive disorder, single episode, unspecified: Secondary | ICD-10-CM

## 2018-04-23 LAB — BASIC METABOLIC PANEL
Anion gap: 10 (ref 5–15)
BUN: 8 mg/dL (ref 6–20)
CALCIUM: 9.3 mg/dL (ref 8.9–10.3)
CO2: 26 mmol/L (ref 22–32)
CREATININE: 0.76 mg/dL (ref 0.44–1.00)
Chloride: 103 mmol/L (ref 101–111)
Glucose, Bld: 107 mg/dL — ABNORMAL HIGH (ref 65–99)
Potassium: 3.4 mmol/L — ABNORMAL LOW (ref 3.5–5.1)
SODIUM: 139 mmol/L (ref 135–145)

## 2018-04-23 LAB — CBC
HCT: 33.2 % — ABNORMAL LOW (ref 36.0–46.0)
Hemoglobin: 9.9 g/dL — ABNORMAL LOW (ref 12.0–15.0)
MCH: 19.2 pg — ABNORMAL LOW (ref 26.0–34.0)
MCHC: 29.8 g/dL — ABNORMAL LOW (ref 30.0–36.0)
MCV: 64.3 fL — ABNORMAL LOW (ref 78.0–100.0)
PLATELETS: 289 10*3/uL (ref 150–400)
RBC: 5.16 MIL/uL — AB (ref 3.87–5.11)
RDW: 18.1 % — ABNORMAL HIGH (ref 11.5–15.5)
WBC: 11.5 10*3/uL — AB (ref 4.0–10.5)

## 2018-04-23 LAB — I-STAT BETA HCG BLOOD, ED (MC, WL, AP ONLY)

## 2018-04-23 LAB — I-STAT TROPONIN, ED: TROPONIN I, POC: 0.03 ng/mL (ref 0.00–0.08)

## 2018-04-23 NOTE — ED Triage Notes (Signed)
Patient c/o CP and SOB x3-4 days. States that the last time this happened she needed a blood transfusion; just wants to make sure she is okay.

## 2018-04-24 ENCOUNTER — Emergency Department (HOSPITAL_COMMUNITY)
Admission: EM | Admit: 2018-04-24 | Discharge: 2018-04-24 | Disposition: A | Discharge status: Home / Self Care | Payer: Medicaid Other | Source: Home / Self Care | Attending: Emergency Medicine | Admitting: Emergency Medicine

## 2018-04-24 DIAGNOSIS — R0789 Other chest pain: Secondary | ICD-10-CM

## 2018-04-24 LAB — D-DIMER, QUANTITATIVE (NOT AT ARMC): D DIMER QUANT: 0.39 ug{FEU}/mL (ref 0.00–0.50)

## 2018-04-24 LAB — I-STAT TROPONIN, ED: TROPONIN I, POC: 0 ng/mL (ref 0.00–0.08)

## 2018-04-24 MED ORDER — IBUPROFEN 600 MG PO TABS: 600.0000 mg | ORAL_TABLET | Freq: Four times a day (QID) | ORAL | 0 refills | Status: DC | PRN

## 2018-04-24 MED ORDER — METHOCARBAMOL 500 MG PO TABS: 1000.0000 mg | ORAL_TABLET | Freq: Three times a day (TID) | ORAL | 0 refills | Status: DC | PRN

## 2018-04-24 NOTE — ED Provider Notes (Signed)
[facility] EMERGENCY DEPARTMENT Provider Note   CSN: [id number] Arrival date & time: 04/23/18  2111     History   Chief Complaint No chief complaint on file.   HPI [name] is a 42 y.o. female.  HPI Patient states she was at work at [employer] when she developed left-sided chest pain.  Describes the pain as both sharp and dull.  Sharp pain is episodic and dull pain is constant.  Pain is worse with movement and deep breathing.  She has some mild shortness of breath especially with exertion.  Denies any cough, fever or chills.  No lower extremity discomfort or asymmetry. Past Medical History:  Diagnosis Date  . Depression   . Diabetes mellitus without complication (HCC)   . Hypertension     There are no active problems to display for this patient.   Past Surgical History:  Procedure Laterality Date  . BTL    . LEEP       OB History   None      Home Medications    Prior to Admission medications   Medication Sig Start Date End Date Taking? Authorizing Provider  acetaminophen (TYLENOL) 500 MG tablet Take 500 mg by mouth every 6 (six) hours as needed for moderate pain.   Yes [provider]  ibuprofen (ADVIL,MOTRIN) 600 MG tablet Take 1 tablet (600 mg total) by mouth every 6 (six) hours as needed. 04/24/18   Loren Racer, MD  methocarbamol (ROBAXIN) 500 MG tablet Take 2 tablets (1,000 mg total) by mouth every 8 (eight) hours as needed. 04/24/18   Loren Racer, MD  oxyCODONE-acetaminophen (PERCOCET/ROXICET) 5-325 MG per tablet Take 2 tablets by mouth every 4 (four) hours as needed for severe pain. Patient not taking: Reported on 04/24/2018 07/17/14   Fayrene Helper, PA-C    Family History Family History  Problem Relation Age of Onset  . Diabetes Mother   . Hypertension Mother   . Thyroid disease Mother   . Kidney Stones Father   . Diabetes Father   . Hypertension Father   . Kidney Stones Sister   . Diabetes Brother   . Hypertension  Brother   . Kidney disease Maternal Grandmother   . Hypertension Maternal Grandmother   . Diabetes Maternal Grandmother   . Kidney Stones Sister     Social History Social History   Tobacco Use  . Smoking status: Current Every Day Smoker    Packs/day: 0.25    Types: Cigarettes  . Smokeless tobacco: Never Used  Substance Use Topics  . Alcohol use: No  . Drug use: No     Allergies   Patient has no known allergies.   Review of Systems Review of Systems  Constitutional: Negative for chills and fever.  HENT: Negative for trouble swallowing.   Eyes: Negative for visual disturbance.  Respiratory: Positive for chest tightness and shortness of breath. Negative for cough and wheezing.   Cardiovascular: Positive for chest pain. Negative for palpitations and leg swelling.  Gastrointestinal: Negative for abdominal pain, constipation, diarrhea, nausea and vomiting.  Musculoskeletal: Positive for myalgias. Negative for back pain, neck pain and neck stiffness.  Skin: Negative for rash and wound.  Neurological: Negative for dizziness, weakness, light-headedness, numbness and headaches.  All other systems reviewed and are negative.    Physical Exam Updated Vital Signs BP (!) 157/82   Pulse 62   Temp 98.1 F (36.7 C) (Oral)   Resp 17   SpO2 100%   Physical  Exam  Constitutional: She is oriented to person, place, and time. She appears well-developed and well-nourished. No distress.  HENT:  Head: Normocephalic and atraumatic.  Mouth/Throat: Oropharynx is clear and moist. No oropharyngeal exudate.  Eyes: Pupils are equal, round, and reactive to light. EOM are normal.  Neck: Normal range of motion. Neck supple. No JVD present.  Cardiovascular: Normal rate and regular rhythm. Exam reveals no gallop and no friction rub.  No murmur heard. Pulmonary/Chest: Effort normal and breath sounds normal. No stridor. No respiratory distress. She has no wheezes. She has no rales. She exhibits  tenderness.  Chest wall tenderness of the left pectoralis.  No crepitance or deformity.  Abdominal: Soft. Bowel sounds are normal. There is no tenderness. There is no rebound and no guarding.  Musculoskeletal: Normal range of motion. She exhibits no edema or tenderness.  No calf swelling, asymmetry or tenderness.  Distal pulses are 2+.  Lymphadenopathy:    She has no cervical adenopathy.  Neurological: She is alert and oriented to person, place, and time.  Moves all extremities without focal deficit.  Sensation fully intact.  Skin: Skin is warm and dry. No rash noted. She is not diaphoretic. No erythema.  Psychiatric: She has a normal mood and affect. Her behavior is normal.  Nursing note and vitals reviewed.    ED Treatments / Results  Labs (all labs ordered are listed, but only abnormal results are displayed) Labs Reviewed  D-DIMER, QUANTITATIVE (NOT AT [facility])  I-STAT TROPONIN, ED    EKG None  Radiology Dg Chest 2 View  Result Date: 04/23/2018 CLINICAL DATA:  Chest pain and dyspnea for 3-4 days. EXAM: CHEST - 2 VIEW COMPARISON:  None. FINDINGS: The heart size and mediastinal contours are within normal limits. Stable dense nodules in the right upper lobe compatible with small granulomata. Both lungs are clear. The visualized skeletal structures are unremarkable. IMPRESSION: No active cardiopulmonary disease.  Right upper lobe granulomata. Electronically Signed   By: Tollie Eth M.D.   On: 04/23/2018 22:05    Procedures Procedures (including critical care time)  Medications Ordered in ED Medications - No data to display   Initial Impression / Assessment and Plan / ED Course  I have reviewed the triage vital signs and the nursing notes.  Pertinent labs & imaging results that were available during my care of the patient were reviewed by me and considered in my medical decision making (see chart for details).    Troponin x2 is normal.  Normal d-dimer.  EKG without abnormal  findings.  Normal chest x-ray.  Symptoms are reproduced with palpation consistent with chest wall etiology.  Will treat symptomatically and return precautions given.   Final Clinical Impressions(s) / ED Diagnoses   Final diagnoses:  Left-sided chest wall pain    ED Discharge Orders        Ordered    ibuprofen (ADVIL,MOTRIN) 600 MG tablet  Every 6 hours PRN     04/24/18 1151    methocarbamol (ROBAXIN) 500 MG tablet  Every 8 hours PRN     04/24/18 1151       Loren Racer, MD 04/24/18 1152

## 2018-04-24 NOTE — ED Notes (Signed)
*  See pt other chart for same visit labs/triage.

## 2018-04-24 NOTE — ED Notes (Signed)
Patient had been asleep and not answered previous called for reassessment of vitals. Patient is alert and oriented and in no apparent distress at this time.

## 2018-04-24 NOTE — ED Triage Notes (Signed)
Patient c/o CP and SOB x3-4 days. States that the last time this happened she needed a blood transfusion; just wants to make sure she is okay. 

## 2018-04-24 NOTE — ED Notes (Signed)
Pt reports chest pain and shortness of breath with walking.

## 2018-04-24 NOTE — ED Notes (Signed)
Labwork has been drawn, charts will be merged by registration.

## 2018-07-04 DIAGNOSIS — R51 Headache: Secondary | ICD-10-CM | POA: Diagnosis not present

## 2018-07-04 DIAGNOSIS — I10 Essential (primary) hypertension: Secondary | ICD-10-CM | POA: Insufficient documentation

## 2018-07-04 DIAGNOSIS — E119 Type 2 diabetes mellitus without complications: Secondary | ICD-10-CM | POA: Insufficient documentation

## 2018-07-04 DIAGNOSIS — R079 Chest pain, unspecified: Secondary | ICD-10-CM | POA: Diagnosis not present

## 2018-07-04 DIAGNOSIS — R110 Nausea: Secondary | ICD-10-CM | POA: Diagnosis not present

## 2018-07-04 DIAGNOSIS — F17210 Nicotine dependence, cigarettes, uncomplicated: Secondary | ICD-10-CM | POA: Insufficient documentation

## 2018-07-04 DIAGNOSIS — R42 Dizziness and giddiness: Secondary | ICD-10-CM | POA: Diagnosis present

## 2018-07-05 ENCOUNTER — Other Ambulatory Visit: Payer: Self-pay

## 2018-07-05 ENCOUNTER — Emergency Department (HOSPITAL_COMMUNITY)
Admission: EM | Admit: 2018-07-05 | Discharge: 2018-07-05 | Disposition: A | Discharge status: Home / Self Care | Payer: Medicaid Other | Attending: Emergency Medicine | Admitting: Emergency Medicine

## 2018-07-05 ENCOUNTER — Encounter (HOSPITAL_COMMUNITY): Payer: Self-pay

## 2018-07-05 DIAGNOSIS — R42 Dizziness and giddiness: Secondary | ICD-10-CM

## 2018-07-05 LAB — COMPREHENSIVE METABOLIC PANEL
ALBUMIN: 4.3 g/dL (ref 3.5–5.0)
ALT: 20 U/L (ref 0–44)
ANION GAP: 9 (ref 5–15)
AST: 27 U/L (ref 15–41)
Alkaline Phosphatase: 85 U/L (ref 38–126)
BILIRUBIN TOTAL: 0.6 mg/dL (ref 0.3–1.2)
BUN: 10 mg/dL (ref 6–20)
CO2: 25 mmol/L (ref 22–32)
Calcium: 9.1 mg/dL (ref 8.9–10.3)
Chloride: 105 mmol/L (ref 98–111)
Creatinine, Ser: 0.69 mg/dL (ref 0.44–1.00)
GFR calc non Af Amer: >60 mL/min (ref >60)
Glucose, Bld: 124 mg/dL — ABNORMAL HIGH (ref 70–99)
POTASSIUM: 4.6 mmol/L (ref 3.5–5.1)
SODIUM: 139 mmol/L (ref 135–145)
TOTAL PROTEIN: 8.1 g/dL (ref 6.5–8.1)

## 2018-07-05 LAB — CBC WITH DIFFERENTIAL/PLATELET
BASOS ABS: 0 10*3/uL (ref 0.0–0.1)
Basophils Relative: 0 %
EOS ABS: 0.2 10*3/uL (ref 0.0–0.7)
Eosinophils Relative: 2 %
HEMATOCRIT: 33.1 % — AB (ref 36.0–46.0)
Hemoglobin: 10.2 g/dL — ABNORMAL LOW (ref 12.0–15.0)
LYMPHS ABS: 3.3 10*3/uL (ref 0.7–4.0)
Lymphocytes Relative: 30 %
MCH: 20.3 pg — AB (ref 26.0–34.0)
MCHC: 30.8 g/dL (ref 30.0–36.0)
MCV: 65.8 fL — ABNORMAL LOW (ref 78.0–100.0)
Monocytes Absolute: 0.7 10*3/uL (ref 0.1–1.0)
Monocytes Relative: 6 %
Neutro Abs: 6.7 10*3/uL (ref 1.7–7.7)
Neutrophils Relative %: 62 %
Platelets: 267 10*3/uL (ref 150–400)
RBC: 5.03 MIL/uL (ref 3.87–5.11)
RDW: 20 % — AB (ref 11.5–15.5)
WBC: 10.9 10*3/uL — ABNORMAL HIGH (ref 4.0–10.5)

## 2018-07-05 LAB — TROPONIN I: Troponin I: <0.03 ng/mL (ref <0.03)

## 2018-07-05 MED ORDER — MECLIZINE HCL 25 MG PO TABS: 25.0000 mg | ORAL_TABLET | Freq: Three times a day (TID) | ORAL | 0 refills | Status: DC | PRN

## 2018-07-05 MED ORDER — MECLIZINE HCL 25 MG PO TABS
50.0000 mg | ORAL_TABLET | Freq: Once | ORAL | Status: AC
  Administered 2018-07-05: 50 mg via ORAL
  Filled 2018-07-05: qty 2

## 2018-07-05 NOTE — ED Triage Notes (Signed)
Pt presents to ED from home for HTN. Pt reports that the last few days she has had dizziness, intermittent blurred vision, and hypertension. Pt reports that she usually checks her BP, and it has been high the last few days.

## 2018-07-05 NOTE — Discharge Instructions (Signed)
Follow up with a primary care office of your choice for recheck of symptoms of dizziness and up-and-down blood pressure. Return here as needed for any new or worsening symptoms.

## 2018-07-05 NOTE — ED Provider Notes (Signed)
[facility]-EMERGENCY DEPT Provider Note   CSN: [id number] Arrival date & time: 07/04/18  2248     [id number]   Chief Complaint Chief Complaint  Patient presents with  . Hypertension    HPI [name] is a 42 y.o. female.  Patient here for evaluation of dizziness, more when changing position or bending over. She reports a dull headache and mild nausea without vomiting. She feels she is walking off balance but denies fall. She has taken her blood pressure when symptomatic and found it elevated, prompting ED evaluation. No history of similar symptoms. No fever, congestion, SOB, cough. She had a brief episode of chest pain while in the waiting room that has not come back. She denies other medical history and is on no medications regularly.  The history is provided by the patient. No language interpreter was used.    Past Medical History:  Diagnosis Date  . Depression   . Diabetes mellitus without complication (HCC)   . Hypertension     There are no active problems to display for this patient.   Past Surgical History:  Procedure Laterality Date  . BTL    . LEEP       OB History   None      Home Medications    Prior to Admission medications   Medication Sig Start Date End Date Taking? Authorizing Provider  acetaminophen (TYLENOL) 500 MG tablet Take 500 mg by mouth every 6 (six) hours as needed for moderate pain.   Yes [provider]  ibuprofen (ADVIL,MOTRIN) 600 MG tablet Take 1 tablet (600 mg total) by mouth every 6 (six) hours as needed. Patient not taking: Reported on 07/05/2018 04/24/18   Loren RacerYelverton, David, MD  methocarbamol (ROBAXIN) 500 MG tablet Take 2 tablets (1,000 mg total) by mouth every 8 (eight) hours as needed. Patient not taking: Reported on 07/05/2018 04/24/18   Loren RacerYelverton, David, MD  oxyCODONE-acetaminophen (PERCOCET/ROXICET) 5-325 MG per tablet Take 2 tablets by mouth every 4 (four) hours as needed for severe  pain. Patient not taking: Reported on 04/24/2018 07/17/14   Fayrene Helperran, [place], PA-C    Family History Family History  Problem Relation Age of Onset  . Diabetes Mother   . Hypertension Mother   . Thyroid disease Mother   . Kidney Stones Father   . Diabetes Father   . Hypertension Father   . Kidney Stones Sister   . Diabetes Brother   . Hypertension Brother   . Kidney disease Maternal Grandmother   . Hypertension Maternal Grandmother   . Diabetes Maternal Grandmother   . Kidney Stones Sister     Social History Social History   Tobacco Use  . Smoking status: Current Every Day Smoker    Packs/day: 0.25    Types: Cigarettes  . Smokeless tobacco: Never Used  Substance Use Topics  . Alcohol use: No  . Drug use: No     Allergies   Patient has no known allergies.   Review of Systems Review of Systems  Constitutional: Negative for chills and fever.  HENT: Negative.  Negative for congestion, sore throat and tinnitus.   Respiratory: Negative.  Negative for cough and shortness of breath.   Cardiovascular: Negative.   Gastrointestinal: Positive for nausea. Negative for abdominal pain and vomiting.  Musculoskeletal: Negative.   Skin: Negative.   Neurological: Positive for dizziness and headaches. Negative for syncope.     Physical Exam Updated Vital Signs BP 103/67 (BP Location: Right Arm)  Pulse 85   Temp 98.5 F (36.9 C) (Oral)   Resp 18   Ht 4\' 11"  (1.499 m)   Wt 94.6 kg (208 lb 9.6 oz)   SpO2 100%   BMI 42.13 kg/m   Physical Exam  Constitutional: She is oriented to person, place, and time. She appears well-developed and well-nourished.  HENT:  Head: Normocephalic.  Neck: Normal range of motion. Neck supple.  Cardiovascular: Normal rate and regular rhythm.  Pulmonary/Chest: Effort normal and breath sounds normal. She has no wheezes. She has no rales.  Abdominal: Soft. Bowel sounds are normal. There is no tenderness. There is no rebound and no guarding.   Musculoskeletal: Normal range of motion.  Neurological: She is alert and oriented to person, place, and time. She has normal strength and normal reflexes. No sensory deficit. She exhibits normal muscle tone. She displays a negative Romberg sign. Coordination and gait normal. GCS eye subscore is 4. GCS verbal subscore is 5. GCS motor subscore is 6.  CN's 3-12 grossly intact. Left lateral nystagmus.  Skin: Skin is warm and dry. No rash noted.  Psychiatric: She has a normal mood and affect.     ED Treatments / Results  Labs (all labs ordered are listed, but only abnormal results are displayed) Labs Reviewed  CBC WITH DIFFERENTIAL/PLATELET  COMPREHENSIVE METABOLIC PANEL  TROPONIN I   Results for orders placed or performed during the hospital encounter of 07/05/18  CBC with Differential  Result Value Ref Range   WBC 10.9 (H) 4.0 - 10.5 K/uL   RBC 5.03 3.87 - 5.11 MIL/uL   Hemoglobin 10.2 (L) 12.0 - 15.0 g/dL   HCT 16.1 (L) 09.6 - 04.5 %   MCV 65.8 (L) 78.0 - 100.0 fL   MCH 20.3 (L) 26.0 - 34.0 pg   MCHC 30.8 30.0 - 36.0 g/dL   RDW 40.9 (H) 81.1 - 91.4 %   Platelets 267 150 - 400 K/uL   Neutrophils Relative % 62 %   Lymphocytes Relative 30 %   Monocytes Relative 6 %   Eosinophils Relative 2 %   Basophils Relative 0 %   Neutro Abs 6.7 1.7 - 7.7 K/uL   Lymphs Abs 3.3 0.7 - 4.0 K/uL   Monocytes Absolute 0.7 0.1 - 1.0 K/uL   Eosinophils Absolute 0.2 0.0 - 0.7 K/uL   Basophils Absolute 0.0 0.0 - 0.1 K/uL   RBC Morphology TARGET CELLS   Comprehensive metabolic panel  Result Value Ref Range   Sodium 139 135 - 145 mmol/L   Potassium 4.6 3.5 - 5.1 mmol/L   Chloride 105 98 - 111 mmol/L   CO2 25 22 - 32 mmol/L   Glucose, Bld 124 (H) 70 - 99 mg/dL   BUN 10 6 - 20 mg/dL   Creatinine, Ser 7.82 0.44 - 1.00 mg/dL   Calcium 9.1 8.9 - 95.6 mg/dL   Total Protein 8.1 6.5 - 8.1 g/dL   Albumin 4.3 3.5 - 5.0 g/dL   AST 27 15 - 41 U/L   ALT 20 0 - 44 U/L   Alkaline Phosphatase 85 38 - 126  U/L   Total Bilirubin 0.6 0.3 - 1.2 mg/dL   GFR calc non Af Amer >60 >60 mL/min   GFR calc Af Amer >60 >60 mL/min   Anion gap 9 5 - 15  Troponin I  Result Value Ref Range   Troponin I <0.03 <0.03 ng/mL    EKG None  Radiology No results found.  Procedures Procedures (including  critical care time)  Medications Ordered in ED Medications  meclizine (ANTIVERT) tablet 50 mg (has no administration in time range)     Initial Impression / Assessment and Plan / ED Course  I have reviewed the triage vital signs and the nursing notes.  Pertinent labs & imaging results that were available during my care of the patient were reviewed by me and considered in my medical decision making (see chart for details).     Patient here with symptoms room-spinning type dizziness, nausea, mild generalized headache and a sense of walking off balance for the past several days. No tinnitus or history of vertigo. She believes symptoms were related to an elevated blood pressure because when symptomatic she would go to a drug store to check it and it was high. No history of HTN.   Blood pressure is either normal or slightly elevated here. Symptoms follow pattern of vertigo and her neurologic exam is nonfocal. Meclizine provided improvement in dizziness. Do not feel a CT is warranted at this time.   She is comfortable with discharge home. Return precautions discussed. Referrals for primary care provided.    Final Clinical Impressions(s) / ED Diagnoses   Final diagnoses:  None   1. Vertigo  ED Discharge Orders    None       Elpidio Anis, Cordelia Poche 07/05/18 1610    Glynn Octave, MD 07/05/18 [phone]

## 2019-02-11 ENCOUNTER — Emergency Department (HOSPITAL_COMMUNITY)
Admission: EM | Admit: 2019-02-11 | Discharge: 2019-02-11 | Disposition: A | Discharge status: Home / Self Care | Payer: BLUE CROSS/BLUE SHIELD | Attending: Emergency Medicine | Admitting: Emergency Medicine

## 2019-02-11 ENCOUNTER — Encounter (HOSPITAL_COMMUNITY): Payer: Self-pay | Admitting: Emergency Medicine

## 2019-02-11 ENCOUNTER — Other Ambulatory Visit: Payer: Self-pay

## 2019-02-11 DIAGNOSIS — J020 Streptococcal pharyngitis: Secondary | ICD-10-CM | POA: Diagnosis not present

## 2019-02-11 DIAGNOSIS — H9202 Otalgia, left ear: Secondary | ICD-10-CM | POA: Diagnosis present

## 2019-02-11 DIAGNOSIS — Z79899 Other long term (current) drug therapy: Secondary | ICD-10-CM | POA: Diagnosis not present

## 2019-02-11 DIAGNOSIS — I10 Essential (primary) hypertension: Secondary | ICD-10-CM | POA: Diagnosis not present

## 2019-02-11 DIAGNOSIS — E119 Type 2 diabetes mellitus without complications: Secondary | ICD-10-CM | POA: Insufficient documentation

## 2019-02-11 DIAGNOSIS — F17210 Nicotine dependence, cigarettes, uncomplicated: Secondary | ICD-10-CM | POA: Diagnosis not present

## 2019-02-11 DIAGNOSIS — R42 Dizziness and giddiness: Secondary | ICD-10-CM | POA: Insufficient documentation

## 2019-02-11 LAB — CBC
HCT: 38 % (ref 36.0–46.0)
HEMOGLOBIN: 11.1 g/dL — AB (ref 12.0–15.0)
MCH: 20.4 pg — AB (ref 26.0–34.0)
MCHC: 29.2 g/dL — AB (ref 30.0–36.0)
MCV: 69.9 fL — ABNORMAL LOW (ref 80.0–100.0)
Platelets: 260 10*3/uL (ref 150–400)
RBC: 5.44 MIL/uL — ABNORMAL HIGH (ref 3.87–5.11)
RDW: 19.9 % — ABNORMAL HIGH (ref 11.5–15.5)
WBC: 10.3 10*3/uL (ref 4.0–10.5)
nRBC: 0 % (ref 0.0–0.2)

## 2019-02-11 LAB — BASIC METABOLIC PANEL
Anion gap: 9 (ref 5–15)
BUN: 13 mg/dL (ref 6–20)
CO2: 23 mmol/L (ref 22–32)
Calcium: 9.2 mg/dL (ref 8.9–10.3)
Chloride: 107 mmol/L (ref 98–111)
Creatinine, Ser: 0.68 mg/dL (ref 0.44–1.00)
GFR calc Af Amer: >60 mL/min (ref >60)
GLUCOSE: 86 mg/dL (ref 70–99)
POTASSIUM: 3.8 mmol/L (ref 3.5–5.1)
Sodium: 139 mmol/L (ref 135–145)

## 2019-02-11 LAB — URINALYSIS, ROUTINE W REFLEX MICROSCOPIC
Bilirubin Urine: NEGATIVE
GLUCOSE, UA: NEGATIVE mg/dL
Hgb urine dipstick: NEGATIVE
Ketones, ur: NEGATIVE mg/dL
Nitrite: NEGATIVE
PROTEIN: 100 mg/dL — AB
Specific Gravity, Urine: 1.014 (ref 1.005–1.030)
pH: 5 (ref 5.0–8.0)

## 2019-02-11 LAB — I-STAT BETA HCG BLOOD, ED (MC, WL, AP ONLY)

## 2019-02-11 LAB — GROUP A STREP BY PCR: Group A Strep by PCR: DETECTED — AB

## 2019-02-11 LAB — CBG MONITORING, ED: GLUCOSE-CAPILLARY: 71 mg/dL (ref 70–99)

## 2019-02-11 MED ORDER — PENICILLIN G BENZATHINE 1200000 UNIT/2ML IM SUSP
1.2000 10*6.[IU] | Freq: Once | INTRAMUSCULAR | Status: AC
  Administered 2019-02-11: 1.2 10*6.[IU] via INTRAMUSCULAR
  Filled 2019-02-11: qty 2

## 2019-02-11 NOTE — ED Provider Notes (Signed)
[facility]-EMERGENCY DEPT Provider Note   CSN: [id number] Arrival date & time: 02/11/19  1715    History   Chief Complaint Chief Complaint  Patient presents with  . Otalgia  . Sore Throat  . Dizziness    HPI [name] is a 43 y.o. female with past medical history hypertension, diabetes, presenting to the emergency department with complaint of sore throat, and left ear pain that began a few days ago.  Associated rhinorrhea.  Patient states she has been treating her symptoms with Tylenol Cold and flu with some moderate relief of symptoms.  Yesterday she felt fatigued and somewhat dizzy.  Has not taken her blood pressure medications and a couple of weeks because she states she cannot afford the refill.  No headaches or vision changes, chest pain, or shortness of breath.  No difficulty swallowing or fever.     The history is provided by the patient.    Past Medical History:  Diagnosis Date  . Depression   . Diabetes mellitus without complication (HCC)   . Hypertension     There are no active problems to display for this patient.   Past Surgical History:  Procedure Laterality Date  . BTL    . LEEP       OB History   No obstetric history on file.      Home Medications    Prior to Admission medications   Medication Sig Start Date End Date Taking? Authorizing Provider  metoprolol succinate (TOPROL-XL) 25 MG 24 hr tablet Take 25 mg by mouth daily. 01/29/19  Yes [provider]  pseudoephedrine-acetaminophen (TYLENOL SINUS) 30-500 MG TABS tablet Take 2 tablets by mouth every 4 (four) hours as needed (pain, sore throat).   Yes [provider]  ibuprofen (ADVIL,MOTRIN) 600 MG tablet Take 1 tablet (600 mg total) by mouth every 6 (six) hours as needed. Patient not taking: Reported on 02/11/2019 04/24/18   Loren Racer, MD  meclizine (ANTIVERT) 25 MG tablet Take 1 tablet (25 mg total) by mouth 3 (three) times daily as needed for  dizziness. Patient not taking: Reported on 02/11/2019 07/05/18   Elpidio Anis, PA-C  methocarbamol (ROBAXIN) 500 MG tablet Take 2 tablets (1,000 mg total) by mouth every 8 (eight) hours as needed. Patient not taking: Reported on 02/11/2019 04/24/18   Loren Racer, MD  oxyCODONE-acetaminophen (PERCOCET/ROXICET) 5-325 MG per tablet Take 2 tablets by mouth every 4 (four) hours as needed for severe pain. Patient not taking: Reported on 02/11/2019 07/17/14   Fayrene Helper, PA-C    Family History Family History  Problem Relation Age of Onset  . Diabetes Mother   . Hypertension Mother   . Thyroid disease Mother   . Kidney Stones Father   . Diabetes Father   . Hypertension Father   . Kidney Stones Sister   . Diabetes Brother   . Hypertension Brother   . Kidney disease Maternal Grandmother   . Hypertension Maternal Grandmother   . Diabetes Maternal Grandmother   . Kidney Stones Sister     Social History Social History   Tobacco Use  . Smoking status: Current Every Day Smoker    Packs/day: 0.25    Types: Cigarettes  . Smokeless tobacco: Never Used  Substance Use Topics  . Alcohol use: No  . Drug use: No     Allergies   Patient has no known allergies.   Review of Systems Review of Systems  Constitutional: Negative for fever.  HENT: Positive  for ear pain, rhinorrhea and sore throat. Negative for trouble swallowing.   Eyes: Negative for photophobia and visual disturbance.  Respiratory: Negative for shortness of breath.   Neurological: Negative for headaches.     Physical Exam Updated Vital Signs BP (!) 188/98 (BP Location: Right Arm)   Pulse 78   Temp 98.6 F (37 C) (Oral)   Resp 18   LMP 01/28/2019   SpO2 98%   Physical Exam Vitals signs and nursing note reviewed.  Constitutional:      General: She is not in acute distress.    Appearance: She is well-developed. She is not ill-appearing.  HENT:     Head: Normocephalic and atraumatic.     Right Ear: Tympanic  membrane and ear canal normal.     Left Ear: Tympanic membrane and ear canal normal.     Nose: Nose normal.     Mouth/Throat:     Mouth: Mucous membranes are moist.     Pharynx: Posterior oropharyngeal erythema present. No oropharyngeal exudate.     Comments: Uvula midline.  No trismus. Eyes:     Extraocular Movements: Extraocular movements intact.     Conjunctiva/sclera: Conjunctivae normal.     Pupils: Pupils are equal, round, and reactive to light.  Neck:     Musculoskeletal: Normal range of motion and neck supple. No neck rigidity.  Cardiovascular:     Rate and Rhythm: Normal rate and regular rhythm.     Heart sounds: Normal heart sounds.  Pulmonary:     Effort: Pulmonary effort is normal. No respiratory distress.     Breath sounds: Normal breath sounds.  Lymphadenopathy:     Cervical: Cervical adenopathy present.  Neurological:     Mental Status: She is alert.     Comments: Mental Status:  Alert, oriented, thought content appropriate, able to give a coherent history. Speech fluent without evidence of aphasia. Able to follow 2 step commands without difficulty.  Cranial Nerves:  II:  Peripheral visual fields grossly normal, pupils equal, round, reactive to light III,IV, VI: ptosis not present, extra-ocular motions intact bilaterally  V,VII: smile symmetric, facial light touch sensation equal VIII: hearing grossly normal to voice  X: uvula elevates symmetrically  XI: bilateral shoulder shrug symmetric and strong XII: midline tongue extension without fassiculations Motor:  Normal tone. 5/5 in upper and lower extremities bilaterally including strong and equal grip strength and dorsiflexion/plantar flexion Sensory: Pinprick and light touch normal in all extremities.  Cerebellar: normal finger-to-nose with bilateral upper extremities Gait: normal gait and balance CV: distal pulses palpable throughout    Psychiatric:        Mood and Affect: Mood normal.        Behavior:  Behavior normal.      ED Treatments / Results  Labs (all labs ordered are listed, but only abnormal results are displayed) Labs Reviewed  GROUP A STREP BY PCR - Abnormal; Notable for the following components:      Result Value   Group A Strep by PCR DETECTED (*)    All other components within normal limits  CBC - Abnormal; Notable for the following components:   RBC 5.44 (*)    Hemoglobin 11.1 (*)    MCV 69.9 (*)    MCH 20.4 (*)    MCHC 29.2 (*)    RDW 19.9 (*)    All other components within normal limits  URINALYSIS, ROUTINE W REFLEX MICROSCOPIC - Abnormal; Notable for the following components:   Color, Urine STRAW (*)  Protein, ur 100 (*)    Leukocytes,Ua TRACE (*)    Bacteria, UA RARE (*)    All other components within normal limits  BASIC METABOLIC PANEL  CBG MONITORING, ED  I-STAT BETA HCG BLOOD, ED ([facility], [facility], [facility] ONLY)    EKG None  Radiology No results found.  Procedures Procedures (including critical care time)  Medications Ordered in ED Medications  penicillin g benzathine (BICILLIN LA) 1200000 UNIT/2ML injection 1.2 Million Units (1.2 Million Units Intramuscular Given 02/11/19 2136)     Initial Impression / Assessment and Plan / ED Course  I have reviewed the triage vital signs and the nursing notes.  Pertinent labs & imaging results that were available during my care of the patient were reviewed by me and considered in my medical decision making (see chart for details).        Pt presenting with sore throat that began 3 days ago. Assoc dysphagia, and rhinorrhea. On exam, pt is tolerating secretions, uvula midline without trismus, tonsillar edema and erythema with exudates, anterior cervical adenopathy present. Positive strep by PCR. Treated in the Ed with PCN IM.  Discussed importance of water rehydration. Presentation not concerning for PTA or infxn spread to soft tissue. Also with HTN, noncompliant with medications. Normal neuro exam. Labs obtained in  triage with normal renal function. Discussed importance of compliance with medications. Recommend pt try goodrx for coupons. Specific return precautions discussed. Pt able to drink water in ED without difficulty with intact air way. Recommended PCP follow up.   Discussed results, findings, treatment and follow up. Patient advised of return precautions. Patient verbalized understanding and agreed with plan.  Final Clinical Impressions(s) / ED Diagnoses   Final diagnoses:  Strep pharyngitis  Hypertension, unspecified type    ED Discharge Orders    None       Robinson, [place] N, PA-C 02/11/19 2240    Mancel Bale, MD 02/12/19 [phone]

## 2019-02-11 NOTE — ED Triage Notes (Signed)
Pt c/o left ear pain, sore throat and dizziness since last night. Reports her BP is high, but is currently not taken medications for it in 2 weeks.

## 2019-02-11 NOTE — Discharge Instructions (Addendum)
[email or web address]  Please read instructions below.  You can take tylenol or ibuprofen as needed for sore throat or fever.  Drink plenty of water.  Use saline nasal spray for congestion. Follow up with your primary care provider as needed.  It is important that you take your blood pressure medication as prescribed. Return to the ER for inability to swallow liquids, difficulty breathing, or new or worsening symptoms.

## 2019-05-10 ENCOUNTER — Telehealth: Payer: Self-pay | Admitting: Internal Medicine

## 2019-05-10 ENCOUNTER — Other Ambulatory Visit: Payer: Self-pay

## 2019-05-10 DIAGNOSIS — Z20822 Contact with and (suspected) exposure to covid-19: Secondary | ICD-10-CM

## 2019-05-10 NOTE — Telephone Encounter (Signed)
Glenda from [facility] health department called stating that patient needs COVID-19 testing. Call placed to patient.Covid-19 test scheduled and order placed.

## 2019-05-13 LAB — NOVEL CORONAVIRUS, NAA: SARS-CoV-2, NAA: NOT DETECTED

## 2020-02-19 ENCOUNTER — Emergency Department
Admission: EM | Admit: 2020-02-19 | Discharge: 2020-02-19 | Disposition: A | Discharge status: Home / Self Care | Payer: Self-pay | Attending: Emergency Medicine | Admitting: Emergency Medicine

## 2020-02-19 ENCOUNTER — Other Ambulatory Visit: Payer: Self-pay

## 2020-02-19 ENCOUNTER — Emergency Department: Payer: Self-pay

## 2020-02-19 ENCOUNTER — Encounter: Payer: Self-pay | Admitting: Emergency Medicine

## 2020-02-19 DIAGNOSIS — R030 Elevated blood-pressure reading, without diagnosis of hypertension: Secondary | ICD-10-CM | POA: Insufficient documentation

## 2020-02-19 DIAGNOSIS — F17210 Nicotine dependence, cigarettes, uncomplicated: Secondary | ICD-10-CM | POA: Insufficient documentation

## 2020-02-19 DIAGNOSIS — R0789 Other chest pain: Secondary | ICD-10-CM | POA: Insufficient documentation

## 2020-02-19 DIAGNOSIS — R519 Headache, unspecified: Secondary | ICD-10-CM | POA: Insufficient documentation

## 2020-02-19 HISTORY — DX: Sickle-cell trait: D57.3

## 2020-02-19 LAB — BASIC METABOLIC PANEL
Anion gap: 13 (ref 5–15)
BUN: 16 mg/dL (ref 6–20)
CO2: 23 mmol/L (ref 22–32)
Calcium: 9.2 mg/dL (ref 8.9–10.3)
Chloride: 105 mmol/L (ref 98–111)
Creatinine, Ser: 0.75 mg/dL (ref 0.44–1.00)
GFR calc Af Amer: >60 mL/min (ref >60)
GFR calc non Af Amer: >60 mL/min (ref >60)
Glucose, Bld: 109 mg/dL — ABNORMAL HIGH (ref 70–99)
Potassium: 4.2 mmol/L (ref 3.5–5.1)
Sodium: 141 mmol/L (ref 135–145)

## 2020-02-19 LAB — CBC
HCT: 37.5 % (ref 36.0–46.0)
Hemoglobin: 11 g/dL — ABNORMAL LOW (ref 12.0–15.0)
MCH: 19.9 pg — ABNORMAL LOW (ref 26.0–34.0)
MCHC: 29.3 g/dL — ABNORMAL LOW (ref 30.0–36.0)
MCV: 67.8 fL — ABNORMAL LOW (ref 80.0–100.0)
Platelets: 246 10*3/uL (ref 150–400)
RBC: 5.53 MIL/uL — ABNORMAL HIGH (ref 3.87–5.11)
RDW: 20 % — ABNORMAL HIGH (ref 11.5–15.5)
WBC: 7.4 10*3/uL (ref 4.0–10.5)
nRBC: 0 % (ref 0.0–0.2)

## 2020-02-19 LAB — POCT PREGNANCY, URINE: Preg Test, Ur: NEGATIVE

## 2020-02-19 LAB — TROPONIN I (HIGH SENSITIVITY): Troponin I (High Sensitivity): 5 ng/L (ref <18)

## 2020-02-19 MED ORDER — SODIUM CHLORIDE 0.9% FLUSH: 3.0000 mL | Freq: Once | INTRAVENOUS | Status: DC

## 2020-02-19 MED ORDER — KETOROLAC TROMETHAMINE 30 MG/ML IJ SOLN
30.0000 mg | Freq: Once | INTRAMUSCULAR | Status: AC
  Administered 2020-02-19: 30 mg via INTRAMUSCULAR
  Filled 2020-02-19: qty 1

## 2020-02-19 NOTE — ED Notes (Signed)
EDP, [place] to bedside. 

## 2020-02-19 NOTE — ED Triage Notes (Signed)
Pt to triage reports headache for last several days.  Reports taking BP yesterday and noted 210/120.  Pt states today BP190/112.  Pt states "tingling" in chest and numbness of arm.  Pt does not take BP meds.

## 2020-02-19 NOTE — ED Provider Notes (Signed)
[facility] Emergency Department Provider Note   ____________________________________________   First MD Initiated Contact with Patient 02/19/20 1313     (approximate)  I have reviewed the triage vital signs and the nursing notes.   HISTORY  Chief Complaint Hypertension and Headache    HPI [name] is a 44 y.o. female with past medical history of sickle cell trait presents to the ED complaining of headache and elevated blood pressure.  Patient reports that she has been having near daily headaches over the past couple of weeks.  She describes these as an aching that affects the entire front of her head, seems to wax and wane throughout the day but is not exacerbated or alleviated by thing in particular.  She denies any associated fevers, chills, nausea, vomiting, vision changes, numbness, or weakness.  She has not been taking anything for her headaches at home, but noted that her blood pressure was significantly elevated and became concerned.  She has also had 2 days of dull ache in the left side of her chest that is constant and not exacerbated or alleviated by anything.  She states that the ache feels like a muscle soreness and she is concerned she might have strained a muscle in her chest due to heavy lifting she does at work.  She has not had any cough or shortness of breath, denies any pain or swelling in her legs.        Past Medical History:  Diagnosis Date  . Sickle cell trait (HCC)     There are no problems to display for this patient.   Past Surgical History:  Procedure Laterality Date  . TUBAL LIGATION      Prior to Admission medications   Not on File    Allergies Patient has no known allergies.  History reviewed. No pertinent family history.  Social History Social History   Tobacco Use  . Smoking status: Light Tobacco Smoker  . Smokeless tobacco: Never Used  Substance Use Topics  . Alcohol use: Yes  . Drug use: Not on  file    Review of Systems  Constitutional: No fever/chills Eyes: No visual changes. ENT: No sore throat. Cardiovascular: Positive for chest pain. Respiratory: Denies shortness of breath. Gastrointestinal: No abdominal pain.  No nausea, no vomiting.  No diarrhea.  No constipation. Genitourinary: Negative for dysuria. Musculoskeletal: Negative for back pain. Skin: Negative for rash. Neurological: Positive for headaches, negative for focal weakness or numbness.  ____________________________________________   PHYSICAL EXAM:  VITAL SIGNS: ED Triage Vitals  Enc Vitals Group     BP 02/19/20 1141 (!) 178/89     Pulse Rate 02/19/20 1141 78     Resp 02/19/20 1141 18     Temp 02/19/20 1141 99 F (37.2 C)     Temp Source 02/19/20 1141 Oral     SpO2 02/19/20 1141 98 %     Weight 02/19/20 1143 200 lb (90.7 kg)     Height 02/19/20 1143 4\' 11"  (1.499 m)     Head Circumference --      Peak Flow --      Pain Score 02/19/20 1142 8     Pain Loc --      Pain Edu? --      Excl. in GC? --     Constitutional: Alert and oriented. Eyes: Conjunctivae are normal. Head: Atraumatic. Nose: No congestion/rhinnorhea. Mouth/Throat: Mucous membranes are moist. Neck: Normal ROM Cardiovascular: Normal rate, regular rhythm. Grossly normal heart sounds.  2+ radial pulses bilaterally. Respiratory: Normal respiratory effort.  No retractions. Lungs CTAB.  Left chest wall tenderness to palpation. Gastrointestinal: Soft and nontender. No distention. Genitourinary: deferred Musculoskeletal: No lower extremity tenderness nor edema. Neurologic:  Normal speech and language. No gross focal neurologic deficits are appreciated. Skin:  Skin is warm, dry and intact. No rash noted. Psychiatric: Mood and affect are normal. Speech and behavior are normal.  ____________________________________________   LABS (all labs ordered are listed, but only abnormal results are displayed)  Labs Reviewed  BASIC METABOLIC  PANEL - Abnormal; Notable for the following components:      Result Value   Glucose, Bld 109 (*)    All other components within normal limits  CBC - Abnormal; Notable for the following components:   RBC 5.53 (*)    Hemoglobin 11.0 (*)    MCV 67.8 (*)    MCH 19.9 (*)    MCHC 29.3 (*)    RDW 20.0 (*)    All other components within normal limits  POC URINE PREG, ED  TROPONIN I (HIGH SENSITIVITY)   ____________________________________________  EKG  ED ECG REPORT I, Blake Divine, the attending physician, personally viewed and interpreted this ECG.   Date: 02/19/2020  EKG Time: 11:49  Rate: 75  Rhythm: normal sinus rhythm  Axis: Normal  Intervals:none  ST&T Change: None   PROCEDURES  Procedure(s) performed (including Critical Care):  Procedures   ____________________________________________   INITIAL IMPRESSION / ASSESSMENT AND PLAN / ED COURSE       44 year old female presents to the ED with a few weeks of near constant headaches as well as a couple of days of left chest soreness.  Doubt SAH or meningitis, patient describes gradual onset of headaches and she has no fevers or meningismus.  She has a benign and nonfocal neurologic exam and I suspect tension headache.  Her chest pain appears musculoskeletal in origin, EKG without acute ischemic changes and troponin is negative.  Chest x-ray also unremarkable.  She is PERC negative and I doubt PE or ACS.  We will treat with IM Toradol, her blood pressure seems reasonable here in the ED and I would hold off on initiating any medications until she is able to follow-up with a PCP.  She does not think she is pregnant and states her LMP was in the last 4 weeks.  Pregnancy testing is negative, patient reports gradual improvement in symptoms following dose of Toradol and her blood pressure is somewhat improved.  She was given referral to establish care with PCP and counseled to return to the ED for new or worsening symptoms.  Patient  agrees with plan.      ____________________________________________   FINAL CLINICAL IMPRESSION(S) / ED DIAGNOSES  Final diagnoses:  Acute nonintractable headache, unspecified headache type  Atypical chest pain  Elevated BP without diagnosis of hypertension     ED Discharge Orders    None       Note:  This document was prepared using Dragon voice recognition software and may include unintentional dictation errors.   Blake Divine, MD 02/19/20 1434

## 2020-05-04 ENCOUNTER — Encounter (HOSPITAL_COMMUNITY): Payer: Self-pay | Admitting: Emergency Medicine

## 2020-06-08 ENCOUNTER — Emergency Department
Admission: EM | Admit: 2020-06-08 | Discharge: 2020-06-08 | Disposition: A | Discharge status: Home / Self Care | Payer: BLUE CROSS/BLUE SHIELD | Attending: Emergency Medicine | Admitting: Emergency Medicine

## 2020-06-08 ENCOUNTER — Emergency Department: Payer: BLUE CROSS/BLUE SHIELD

## 2020-06-08 ENCOUNTER — Other Ambulatory Visit: Payer: Self-pay

## 2020-06-08 DIAGNOSIS — E119 Type 2 diabetes mellitus without complications: Secondary | ICD-10-CM | POA: Insufficient documentation

## 2020-06-08 DIAGNOSIS — F17200 Nicotine dependence, unspecified, uncomplicated: Secondary | ICD-10-CM | POA: Insufficient documentation

## 2020-06-08 DIAGNOSIS — R059 Cough, unspecified: Secondary | ICD-10-CM

## 2020-06-08 DIAGNOSIS — R0981 Nasal congestion: Secondary | ICD-10-CM | POA: Insufficient documentation

## 2020-06-08 DIAGNOSIS — R05 Cough: Secondary | ICD-10-CM | POA: Insufficient documentation

## 2020-06-08 DIAGNOSIS — H6983 Other specified disorders of Eustachian tube, bilateral: Secondary | ICD-10-CM

## 2020-06-08 DIAGNOSIS — I10 Essential (primary) hypertension: Secondary | ICD-10-CM | POA: Insufficient documentation

## 2020-06-08 MED ORDER — CLONIDINE HCL 0.1 MG PO TABS
0.2000 mg | ORAL_TABLET | Freq: Once | ORAL | Status: AC
  Administered 2020-06-08: 0.2 mg via ORAL
  Filled 2020-06-08: qty 2

## 2020-06-08 MED ORDER — PSEUDOEPHEDRINE-ACETAMINOPHEN 30-500 MG PO TABS: 2.0000 | ORAL_TABLET | Freq: Four times a day (QID) | ORAL | 0 refills | Status: DC | PRN

## 2020-06-08 MED ORDER — ACETAMINOPHEN 325 MG PO TABS
650.0000 mg | ORAL_TABLET | Freq: Once | ORAL | Status: AC
  Administered 2020-06-08: 650 mg via ORAL
  Filled 2020-06-08: qty 2

## 2020-06-08 MED ORDER — BENZONATATE 100 MG PO CAPS: 200.0000 mg | ORAL_CAPSULE | Freq: Three times a day (TID) | ORAL | 0 refills | Status: DC | PRN

## 2020-06-08 MED ORDER — METOPROLOL TARTRATE 25 MG PO TABS: 25.0000 mg | ORAL_TABLET | Freq: Two times a day (BID) | ORAL | 11 refills | Status: AC

## 2020-06-08 NOTE — ED Notes (Signed)
See triage note  Presents with cough and bilateral ear pain   States cough started yesterday  No fever

## 2020-06-08 NOTE — ED Triage Notes (Signed)
Pt c/o Bl ear pain with a cough for the past couple of days.

## 2020-06-08 NOTE — ED Provider Notes (Signed)
[facility] Emergency Department Provider Note   ____________________________________________   First MD Initiated Contact with Patient 06/08/20 [phone]     (approximate)  I have reviewed the triage vital signs and the nursing notes.   HISTORY  Chief Complaint Cough and Otalgia    HPI [name] is a 44 y.o. female patient complain of bilateral ear pain and nonproductive cough for 4 days.  Patient also complained of decreased hearing mostly in the left ear.  Ear pain is greater than the right than the left.  Patient also complained of nasal congestion.  It was noted that the patient blood pressure is elevated.  Patient gives a history of noncompliance with blood pressure medication.  Patient rates the pain complaints a 10/10.  No palliative measure for complaint.         Past Medical History:  Diagnosis Date  . Depression   . Diabetes mellitus without complication (HCC)   . Hypertension   . Sickle cell trait (HCC)     There are no problems to display for this patient.   Past Surgical History:  Procedure Laterality Date  . BTL    . LEEP    . TUBAL LIGATION      Prior to Admission medications   Medication Sig Start Date End Date Taking? Authorizing Provider  benzonatate (TESSALON PERLES) 100 MG capsule Take 2 capsules (200 mg total) by mouth 3 (three) times daily as needed. 06/08/20 06/08/21  Joni Reining, PA-C  metoprolol tartrate (LOPRESSOR) 25 MG tablet Take 1 tablet (25 mg total) by mouth 2 (two) times daily. 06/08/20 06/08/21  Joni Reining, PA-C  pseudoephedrine-acetaminophen (TYLENOL SINUS) 30-500 MG TABS tablet Take 2 tablets by mouth every 6 (six) hours as needed (pain, sore throat). 06/08/20   Joni Reining, PA-C    Allergies Patient has no known allergies.  Family History  Problem Relation Age of Onset  . Diabetes Mother   . Hypertension Mother   . Thyroid disease Mother   . Kidney Stones Father   . Diabetes Father    . Hypertension Father   . Kidney Stones Sister   . Diabetes Brother   . Hypertension Brother   . Kidney disease Maternal Grandmother   . Hypertension Maternal Grandmother   . Diabetes Maternal Grandmother   . Kidney Stones Sister     Social History Social History   Tobacco Use  . Smoking status: Light Tobacco Smoker  . Smokeless tobacco: Never Used  Vaping Use  . Vaping Use: Never used  Substance Use Topics  . Alcohol use: Yes  . Drug use: No    Review of Systems Constitutional: No fever/chills Eyes: No visual changes. ENT: No sore throat. Cardiovascular: Denies chest pain. Respiratory: Denies shortness of breath. Gastrointestinal: No abdominal pain.  No nausea, no vomiting.  No diarrhea.  No constipation. Genitourinary: Negative for dysuria. Musculoskeletal: Negative for back pain. Skin: Negative for rash. Neurological: Negative for headaches, focal weakness or numbness. Psychiatric: Depression  Endocrine:  Gestational diabetes.  Hypertension with noncompliance. Hematological/Lymphatic:  Sickle cell trait ____________________________________________   PHYSICAL EXAM:  VITAL SIGNS: ED Triage Vitals  Enc Vitals Group     BP 06/08/20 0838 (!) 191/104     Pulse Rate 06/08/20 0838 89     Resp 06/08/20 0838 18     Temp 06/08/20 0841 99 F (37.2 C)     Temp Source 06/08/20 0838 Oral     SpO2 06/08/20 0838 99 %  Weight 06/08/20 0839 210 lb (95.3 kg)     Height 06/08/20 0839 4\' 11"  (1.499 m)     Head Circumference --      Peak Flow --      Pain Score 06/08/20 0839 10     Pain Loc --      Pain Edu? --      Excl. in [place]? --    Constitutional: Alert and oriented. Well appearing and in no acute distress. Eyes: Conjunctivae are normal. PERRL. EOMI. Head: Atraumatic. Nose: Edematous nasal turbinates. EARS: Edematous but not erythematous bilateral TM Mouth/Throat: Mucous membranes are moist.  Oropharynx non-erythematous. Neck: No stridor.    Hematological/Lymphatic/Immunilogical: No cervical lymphadenopathy. Cardiovascular: Normal rate, regular rhythm. Grossly normal heart sounds.  Good peripheral circulation.  Elevated blood pressure. Respiratory: Normal respiratory effort.  No retractions. Lungs CTAB. Skin:  Skin is warm, dry and intact. No rash noted.   ____________________________________________   LABS (all labs ordered are listed, but only abnormal results are displayed)  Labs Reviewed - No data to display ____________________________________________  EKG   ____________________________________________  RADIOLOGY  ED MD interpretation:    Official radiology report(s): DG Chest Portable 1 View  Result Date: 06/08/2020 CLINICAL DATA:  Onset cough and bilateral ear pain yesterday. EXAM: PORTABLE CHEST 1 VIEW COMPARISON:  PA and lateral chest 02/19/2020. FINDINGS: Lungs clear. Heart size normal. No pneumothorax or pleural fluid. No acute or focal bony abnormality. IMPRESSION: Normal chest. Electronically Signed   By: Inge Rise M.D.   On: 06/08/2020 10:15    ____________________________________________   PROCEDURES  Procedure(s) performed (including Critical Care):  Procedures   ____________________________________________   INITIAL IMPRESSION / ASSESSMENT AND PLAN / ED COURSE  As part of my medical decision making, I reviewed the following data within the [place]     Patient presents with bilateral ear pain/pressure.  Patient also has a cough for few days.  Patient has hypertension is noncompliant with medications.  Discussed no acute findings on x-ray of the chest.  Patient blood pressure decreased from 190/104 to 160/95 status post 0.2 mg of Catapres.  Patient complaint physical exam consistent eustachian tube dysfunction and elevated blood pressure.  Patient given discharge care instruction advised [facility]  Take medication as directed.           ____________________________________________   FINAL CLINICAL IMPRESSION(S) / ED DIAGNOSES  Final diagnoses:  Eustachian tube dysfunction, bilateral  Nasal sinus congestion  Cough  Elevated blood pressure reading in office with diagnosis of hypertension     ED Discharge Orders         Ordered    pseudoephedrine-acetaminophen (TYLENOL SINUS) 30-500 MG TABS tablet  Every 6 hours PRN     Discontinue  Reprint     06/08/20 1119    metoprolol tartrate (LOPRESSOR) 25 MG tablet  2 times daily     Discontinue  Reprint     06/08/20 1119    benzonatate (TESSALON PERLES) 100 MG capsule  3 times daily PRN     Discontinue  Reprint     06/08/20 1120           Note:  This document was prepared using Dragon voice recognition software and may include unintentional dictation errors.    Sable Feil, PA-C 06/08/20 1124    Earleen Newport, MD 06/08/20 [phone]

## 2020-06-11 ENCOUNTER — Emergency Department
Admission: EM | Admit: 2020-06-11 | Discharge: 2020-06-11 | Disposition: A | Discharge status: Home / Self Care | Payer: Self-pay | Attending: Emergency Medicine | Admitting: Emergency Medicine

## 2020-06-11 ENCOUNTER — Other Ambulatory Visit: Payer: Self-pay

## 2020-06-11 ENCOUNTER — Encounter: Payer: Self-pay | Admitting: Emergency Medicine

## 2020-06-11 ENCOUNTER — Emergency Department: Payer: Self-pay

## 2020-06-11 DIAGNOSIS — E119 Type 2 diabetes mellitus without complications: Secondary | ICD-10-CM | POA: Insufficient documentation

## 2020-06-11 DIAGNOSIS — H6983 Other specified disorders of Eustachian tube, bilateral: Secondary | ICD-10-CM

## 2020-06-11 DIAGNOSIS — I10 Essential (primary) hypertension: Secondary | ICD-10-CM | POA: Insufficient documentation

## 2020-06-11 DIAGNOSIS — Z79899 Other long term (current) drug therapy: Secondary | ICD-10-CM | POA: Insufficient documentation

## 2020-06-11 DIAGNOSIS — J069 Acute upper respiratory infection, unspecified: Secondary | ICD-10-CM | POA: Insufficient documentation

## 2020-06-11 DIAGNOSIS — R05 Cough: Secondary | ICD-10-CM | POA: Insufficient documentation

## 2020-06-11 DIAGNOSIS — F17210 Nicotine dependence, cigarettes, uncomplicated: Secondary | ICD-10-CM | POA: Insufficient documentation

## 2020-06-11 DIAGNOSIS — H6990 Unspecified Eustachian tube disorder, unspecified ear: Secondary | ICD-10-CM | POA: Insufficient documentation

## 2020-06-11 DIAGNOSIS — R0981 Nasal congestion: Secondary | ICD-10-CM | POA: Insufficient documentation

## 2020-06-11 MED ORDER — HYDROCOD POLST-CPM POLST ER 10-8 MG/5ML PO SUER: 5.0000 mL | Freq: Two times a day (BID) | ORAL | 0 refills | Status: DC

## 2020-06-11 NOTE — ED Notes (Signed)
Pt c/o cough with sinus and chest congestion since Sunday with BL ear pain, worse on the right side. Pt is tearful, states she was seen here on Monday for the same and her sx are just worsening.

## 2020-06-11 NOTE — Discharge Instructions (Signed)
Your chest x-ray shows no abnormalities.  Due to your continued ear pain advised to follow-up with the ENT clinic by calling today for an appointment.  Take Tussionex for your cough.  This medicine does has codeine in it and rease your ear pain pending evaluation by ENT clinic.

## 2020-06-11 NOTE — ED Provider Notes (Signed)
[facility] Emergency Department Provider Note   ____________________________________________   First MD Initiated Contact with Patient 06/11/20 1007     (approximate)  I have reviewed the triage vital signs and the nursing notes.   HISTORY  Chief Complaint URI    HPI [name] is a 44 y.o. female patient presents with 1 month of ear pain.  Patient was seen last month for the same complaint stated no relief with decongestants.  Patient denies hearing loss with complaint.  Patient denies vertigo.  Patient also complain of productive cough with chest congestion.  Patient states sputum is clear.  Patient denies fever chills associated complaint.  Patient denies recent travel or known contact with COVID-19.     Past Medical History:  Diagnosis Date   Depression    Diabetes mellitus without complication (HCC)    Hypertension    Sickle cell trait (HCC)     There are no problems to display for this patient.   Past Surgical History:  Procedure Laterality Date   BTL     LEEP     TUBAL LIGATION      Prior to Admission medications   Medication Sig Start Date End Date Taking? Authorizing Provider  benzonatate (TESSALON PERLES) 100 MG capsule Take 2 capsules (200 mg total) by mouth 3 (three) times daily as needed. 06/08/20 06/08/21  Joni Reining, PA-C  chlorpheniramine-HYDROcodone (TUSSIONEX PENNKINETIC ER) 10-8 MG/5ML SUER Take 5 mLs by mouth 2 (two) times daily. 06/11/20   Joni Reining, PA-C  metoprolol tartrate (LOPRESSOR) 25 MG tablet Take 1 tablet (25 mg total) by mouth 2 (two) times daily. 06/08/20 06/08/21  Joni Reining, PA-C  pseudoephedrine-acetaminophen (TYLENOL SINUS) 30-500 MG TABS tablet Take 2 tablets by mouth every 6 (six) hours as needed (pain, sore throat). 06/08/20   Joni Reining, PA-C    Allergies Patient has no known allergies.  Family History  Problem Relation Age of Onset   Diabetes Mother     Hypertension Mother    Thyroid disease Mother    Kidney Stones Father    Diabetes Father    Hypertension Father    Kidney Stones Sister    Diabetes Brother    Hypertension Brother    Kidney disease Maternal Grandmother    Hypertension Maternal Grandmother    Diabetes Maternal Grandmother    Kidney Stones Sister     Social History Social History   Tobacco Use   Smoking status: Light Tobacco Smoker   Smokeless tobacco: Never Used  Vaping Use   Vaping Use: Never used  Substance Use Topics   Alcohol use: Yes   Drug use: No    Review of Systems  Constitutional: No fever/chills Eyes: No visual changes. ENT: No sore throat. Cardiovascular: Denies chest pain. Respiratory: Denies shortness of breath. Gastrointestinal: No abdominal pain.  No nausea, no vomiting.  No diarrhea.  No constipation. Genitourinary: Negative for dysuria. Musculoskeletal: Negative for back pain. Skin: Negative for rash. Neurological: Negative for headaches, focal weakness or numbness. Psychiatric:  Depression Endocrine:  Diabetes and hypertension. Hematological/Lymphatic:  Sickle cell trait.   ____________________________________________   PHYSICAL EXAM:  VITAL SIGNS: ED Triage Vitals  Enc Vitals Group     BP 06/11/20 0953 (!) 168/97     Pulse Rate 06/11/20 0953 74     Resp 06/11/20 0953 16     Temp 06/11/20 0953 99.1 F (37.3 C)     Temp Source 06/11/20 0953 Oral  SpO2 06/11/20 0953 100 %     Weight 06/11/20 0950 210 lb 1.6 oz (95.3 kg)     Height 06/11/20 0950 4\' 11"  (1.499 m)     Head Circumference --      Peak Flow --      Pain Score 06/11/20 0950 0     Pain Loc --      Pain Edu? --      Excl. in GC? --    Constitutional: Alert and oriented. Well appearing and in no acute distress. Nose: No congestion/rhinnorhea. Mouth/Throat: Mucous membranes are moist.  Oropharynx non-erythematous. EAR: Edematous nonerythematous TM. Hematological/Lymphatic/Immunilogical:  No cervical lymphadenopathy. Cardiovascular: Normal rate, regular rhythm. Grossly normal heart sounds.  Good peripheral circulation.  Elevated blood pressure. Respiratory: Normal respiratory effort.  No retractions. Lungs CTAB. Neurologic:  Normal speech and language. No gross focal neurologic deficits are appreciated. No gait instability. Skin:  Skin is warm, dry and intact. No rash noted. Psychiatric: Mood and affect are normal. Speech and behavior are normal.  ____________________________________________   LABS (all labs ordered are listed, but only abnormal results are displayed)  Labs Reviewed - No data to display ____________________________________________  EKG   ____________________________________________  RADIOLOGY  ED MD interpretation:    Official radiology report(s): DG Chest Portable 1 View  Result Date: 06/11/2020 CLINICAL DATA:  Productive cough for 4 days EXAM: PORTABLE CHEST 1 VIEW COMPARISON:  06/08/2020 chest radiograph. FINDINGS: Stable cardiomediastinal silhouette with normal heart size. No pneumothorax. No pleural effusion. Lungs appear clear, with no acute consolidative airspace disease and no pulmonary edema. IMPRESSION: No active disease. Electronically Signed   By: 06/10/2020 M.D.   On: 06/11/2020 10:29    ____________________________________________   PROCEDURES  Procedure(s) performed (including Critical Care):  Procedures   ____________________________________________   INITIAL IMPRESSION / ASSESSMENT AND PLAN / ED COURSE  As part of my medical decision making, I reviewed the following data within the electronic MEDICAL RECORD NUMBER     Patient presents with continue bilateral ear pain.  Patient also complaining of productive cough and chest congestion.  Discussed negative x-ray findings with patient.  Advised patient consult to ENT will be generated.  Patient given prescription for Tussionex to take as directed for cough.  Advised definitive  evaluation treatment ENT is warranted at this time.     [name] was evaluated in Emergency Department on 06/11/2020 for the symptoms described in the history of present illness. She was evaluated in the context of the global COVID-19 pandemic, which necessitated consideration that the patient might be at risk for infection with the SARS-CoV-2 virus that causes COVID-19. Institutional protocols and algorithms that pertain to the evaluation of patients at risk for COVID-19 are in a state of rapid change based on information released by regulatory bodies including the CDC and federal and state organizations. These policies and algorithms were followed during the patient's care in the ED.       ____________________________________________   FINAL CLINICAL IMPRESSION(S) / ED DIAGNOSES  Final diagnoses:  Eustachian tube dysfunction, bilateral  Viral URI with cough     ED Discharge Orders         Ordered    chlorpheniramine-HYDROcodone (TUSSIONEX PENNKINETIC ER) 10-8 MG/5ML SUER  2 times daily     Discontinue  Reprint     06/11/20 1040           Note:  This document was prepared using Dragon voice recognition software and may include unintentional dictation errors.  Sable Feil, PA-C 06/11/20 1048    Arta Silence, MD 06/12/20 [phone]

## 2020-06-11 NOTE — ED Triage Notes (Signed)
Cough and bilateral ear pain x 4 days.  States right ear worse than left.Marland Kitchen  AAOx3.  Skin warm and dry. NAD

## 2020-07-07 ENCOUNTER — Telehealth: Payer: Self-pay | Admitting: General Practice

## 2020-07-07 NOTE — Telephone Encounter (Signed)
Individual has been contacted 3+ times regarding ED referral. No further attempts to contact individual will be made. 

## 2020-07-24 ENCOUNTER — Other Ambulatory Visit: Payer: Self-pay | Admitting: Family Medicine

## 2020-07-24 DIAGNOSIS — Z1231 Encounter for screening mammogram for malignant neoplasm of breast: Secondary | ICD-10-CM

## 2021-04-29 ENCOUNTER — Emergency Department
Admission: EM | Admit: 2021-04-29 | Discharge: 2021-04-29 | Disposition: A | Discharge status: Home / Self Care | Payer: Medicaid Other | Attending: Student in an Organized Health Care Education/Training Program | Admitting: Student in an Organized Health Care Education/Training Program

## 2021-04-29 ENCOUNTER — Other Ambulatory Visit: Payer: Self-pay

## 2021-04-29 ENCOUNTER — Encounter: Payer: Self-pay | Admitting: Emergency Medicine

## 2021-04-29 DIAGNOSIS — Z20822 Contact with and (suspected) exposure to covid-19: Secondary | ICD-10-CM | POA: Insufficient documentation

## 2021-04-29 DIAGNOSIS — R110 Nausea: Secondary | ICD-10-CM | POA: Insufficient documentation

## 2021-04-29 DIAGNOSIS — I10 Essential (primary) hypertension: Secondary | ICD-10-CM | POA: Insufficient documentation

## 2021-04-29 DIAGNOSIS — E119 Type 2 diabetes mellitus without complications: Secondary | ICD-10-CM | POA: Insufficient documentation

## 2021-04-29 DIAGNOSIS — F17200 Nicotine dependence, unspecified, uncomplicated: Secondary | ICD-10-CM | POA: Insufficient documentation

## 2021-04-29 DIAGNOSIS — J069 Acute upper respiratory infection, unspecified: Secondary | ICD-10-CM | POA: Insufficient documentation

## 2021-04-29 DIAGNOSIS — R030 Elevated blood-pressure reading, without diagnosis of hypertension: Secondary | ICD-10-CM

## 2021-04-29 LAB — RESP PANEL BY RT-PCR (FLU A&B, COVID) ARPGX2
Influenza A by PCR: NEGATIVE
Influenza B by PCR: NEGATIVE
SARS Coronavirus 2 by RT PCR: NEGATIVE

## 2021-04-29 NOTE — ED Provider Notes (Signed)
[facility] Emergency Department Provider Note  ____________________________________________   Event Date/Time   First MD Initiated Contact with Patient 04/29/21 1018     (approximate)  I have reviewed the triage vital signs and the nursing notes.   HISTORY  Chief Complaint Generalized Body Aches and Nasal Congestion   HPI [name] is a 45 y.o. female presents to the ED with complaint of nasal congestion, body aches and cough that began 2 days ago.  Patient does work in healthcare and states that she did have the COVID-vaccine but does not remember if specifically which brand she got.  She reports some change in taste and smell.  There has been some nausea but no vomiting or diarrhea.  She rates her pain as an 8 out of 10.       Past Medical History:  Diagnosis Date  . Depression   . Diabetes mellitus without complication (HCC)   . Hypertension   . Sickle cell trait (HCC)     There are no problems to display for this patient.   Past Surgical History:  Procedure Laterality Date  . BTL    . LEEP    . TUBAL LIGATION      Prior to Admission medications   Medication Sig Start Date End Date Taking? Authorizing Provider  metoprolol tartrate (LOPRESSOR) 25 MG tablet Take 1 tablet (25 mg total) by mouth 2 (two) times daily. 06/08/20 06/08/21  Joni Reining, PA-C    Allergies Patient has no known allergies.  Family History  Problem Relation Age of Onset  . Diabetes Mother   . Hypertension Mother   . Thyroid disease Mother   . Kidney Stones Father   . Diabetes Father   . Hypertension Father   . Kidney Stones Sister   . Diabetes Brother   . Hypertension Brother   . Kidney disease Maternal Grandmother   . Hypertension Maternal Grandmother   . Diabetes Maternal Grandmother   . Kidney Stones Sister     Social History Social History   Tobacco Use  . Smoking status: Light Tobacco Smoker  . Smokeless tobacco: Never Used  Vaping  Use  . Vaping Use: Never used  Substance Use Topics  . Alcohol use: Yes  . Drug use: No    Review of Systems Constitutional: No fever/chills Eyes: No visual changes. ENT: Positive for nasal congestion. Cardiovascular: Denies chest pain. Respiratory: Denies shortness of breath.  Positive for cough. Gastrointestinal: No abdominal pain.  Positive nausea, no vomiting.  No diarrhea.   Musculoskeletal: Positive for musculoskeletal aches. Skin: Negative for rash. Neurological: Negative for headaches, focal weakness or numbness. ____________________________________________   PHYSICAL EXAM:  VITAL SIGNS: ED Triage Vitals [04/29/21 1014]  Enc Vitals Group     BP (!) 168/108     Pulse Rate 90     Resp 17     Temp 99.1 F (37.3 C)     Temp Source Oral     SpO2 98 %     Weight 240 lb (108.9 kg)     Height 4\' 11"  (1.499 m)     Head Circumference      Peak Flow      Pain Score 8     Pain Loc      Pain Edu?      Excl. in GC?     Constitutional: Alert and oriented. Well appearing and in no acute distress. Eyes: Conjunctivae are normal. PERRL. EOMI. Head: Atraumatic. Nose: Positive  congestion/rhinnorhea. Mouth/Throat: Mucous membranes are moist.  Oropharynx non-erythematous. Neck: No stridor.   Cardiovascular: Normal rate, regular rhythm. Grossly normal heart sounds.  Good peripheral circulation. Respiratory: Normal respiratory effort.  No retractions. Lungs CTAB. Gastrointestinal: Soft and nontender. No distention. Musculoskeletal: No lower extremity tenderness nor edema.  No joint effusions. Neurologic:  Normal speech and language. No gross focal neurologic deficits are appreciated. No gait instability. Skin:  Skin is warm, dry and intact. No rash noted. Psychiatric: Mood and affect are normal. Speech and behavior are normal.  ____________________________________________   LABS (all labs ordered are listed, but only abnormal results are displayed)  Labs Reviewed  RESP  PANEL BY RT-PCR (FLU A&B, COVID) [id number]   ____________________________________________  PROCEDURES  Procedure(s) performed (including Critical Care):  Procedures   ____________________________________________   INITIAL IMPRESSION / ASSESSMENT AND PLAN / ED COURSE  As part of my medical decision making, I reviewed the following data within the electronic MEDICAL RECORD NUMBER Notes from prior ED visits and [place] Controlled Substance Database  45 year old female presents to the ED with complaint of nasal congestion body aches for the last 2 days.  Patient initially thought that it was allergies but when she woke with body aches this morning she was not sure.  Physical exam is unremarkable with the exception of nasal congestion.  Her respiratory swab is negative for COVID and influenza.  Patient is encouraged to continue with her allergy medication over-the-counter and drink lots of fluids.  She also has an elevated blood pressure reading today and is to follow-up with her PCP for recheck of her blood pressure. ____________________________________________   FINAL CLINICAL IMPRESSION(S) / ED DIAGNOSES  Final diagnoses:  Viral upper respiratory tract infection  Elevated blood pressure reading     ED Discharge Orders    None      *Please note:  [name] was evaluated in Emergency Department on 04/29/2021 for the symptoms described in the history of present illness. She was evaluated in the context of the global COVID-19 pandemic, which necessitated consideration that the patient might be at risk for infection with the SARS-CoV-2 virus that causes COVID-19. Institutional protocols and algorithms that pertain to the evaluation of patients at risk for COVID-19 are in a state of rapid change based on information released by regulatory bodies including the CDC and federal and state organizations. These policies and algorithms were followed during the patient's care in the ED.  Some ED  evaluations and interventions may be delayed as a result of limited staffing during and the pandemic.*   Note:  This document was prepared using Dragon voice recognition software and may include unintentional dictation errors.    Tommi Rumps, PA-C 04/29/21 1616    Willy Eddy, MD 04/30/21 1556

## 2021-04-29 NOTE — ED Notes (Signed)
PT signed physical discharge form and ambulated to lobby.

## 2021-04-29 NOTE — Discharge Instructions (Signed)
Call today and make an appointment with your primary care provider to have your blood pressure rechecked.  Today in the ED initially your blood pressure was 168/108.  If your blood pressure continues to be elevated you will need to be placed on a blood pressure medication.  Drink lots of fluids and Tylenol or ibuprofen as needed for body aches, headache or fever.  Your COVID and influenza test are negative.  If you are currently taking any allergy medication you may continue to do so.

## 2021-04-29 NOTE — ED Triage Notes (Signed)
Pt comes into the ED via POV c/o body aches and nasal congestion x 2 days.  Pt states she thought it was allergies byut then she woke up this morning with the body aches.  Pt has even and unlabored respirations and in NAD./
